# Patient Record
Sex: Female | Born: 1940
Health system: Southern US, Community
[De-identification: ages and names within clinical notes are randomized; demographics above are authoritative.]

## PROBLEM LIST (undated history)

## (undated) DIAGNOSIS — G4730 Sleep apnea, unspecified: Secondary | ICD-10-CM

## (undated) DIAGNOSIS — T8859XA Other complications of anesthesia, initial encounter: Secondary | ICD-10-CM

## (undated) DIAGNOSIS — I2510 Atherosclerotic heart disease of native coronary artery without angina pectoris: Secondary | ICD-10-CM

## (undated) DIAGNOSIS — J45909 Unspecified asthma, uncomplicated: Secondary | ICD-10-CM

## (undated) DIAGNOSIS — K589 Irritable bowel syndrome without diarrhea: Secondary | ICD-10-CM

## (undated) DIAGNOSIS — I10 Essential (primary) hypertension: Secondary | ICD-10-CM

## (undated) DIAGNOSIS — Z9889 Other specified postprocedural states: Secondary | ICD-10-CM

## (undated) DIAGNOSIS — D509 Iron deficiency anemia, unspecified: Secondary | ICD-10-CM

## (undated) DIAGNOSIS — E785 Hyperlipidemia, unspecified: Secondary | ICD-10-CM

## (undated) HISTORY — PX: TONSILLECTOMY: SUR1361

## (undated) HISTORY — DX: Essential (primary) hypertension: I10

## (undated) HISTORY — DX: Hyperlipidemia, unspecified: E78.5

## (undated) HISTORY — DX: Morbid (severe) obesity due to excess calories: E66.01

## (undated) HISTORY — DX: Irritable bowel syndrome, unspecified: K58.9

## (undated) HISTORY — PX: CARPAL TUNNEL RELEASE: SHX101

## (undated) HISTORY — PX: DILATION AND CURETTAGE OF UTERUS: SHX78

## (undated) HISTORY — DX: Iron deficiency anemia, unspecified: D50.9

## (undated) HISTORY — DX: Unspecified asthma, uncomplicated: J45.909

---

## 1985-05-19 HISTORY — PX: CHOLECYSTECTOMY OPEN: SUR202

## 1991-05-20 HISTORY — PX: OTHER SURGICAL HISTORY: SHX169

## 1998-11-11 ENCOUNTER — Inpatient Hospital Stay (HOSPITAL_COMMUNITY): Admission: EM | Admit: 1998-11-11 | Discharge: 1998-11-16 | Payer: Self-pay | Admitting: Cardiology

## 1998-11-15 ENCOUNTER — Encounter: Payer: Self-pay | Admitting: Cardiology

## 1998-11-16 ENCOUNTER — Encounter: Payer: Self-pay | Admitting: Cardiology

## 2003-01-25 HISTORY — PX: INCISIONAL HERNIA REPAIR: SHX193

## 2007-06-03 ENCOUNTER — Ambulatory Visit: Payer: Self-pay | Admitting: Cardiology

## 2007-07-04 ENCOUNTER — Encounter: Payer: Self-pay | Admitting: Cardiology

## 2007-07-05 ENCOUNTER — Ambulatory Visit: Payer: Self-pay | Admitting: Cardiology

## 2007-07-06 ENCOUNTER — Ambulatory Visit: Admission: AD | Admit: 2007-07-06 | Discharge: 2007-07-06 | Payer: Self-pay | Admitting: Cardiovascular Disease

## 2007-07-06 ENCOUNTER — Ambulatory Visit: Payer: Self-pay | Admitting: Cardiology

## 2007-07-06 ENCOUNTER — Inpatient Hospital Stay (HOSPITAL_COMMUNITY): Admission: EM | Admit: 2007-07-06 | Discharge: 2007-07-07 | Payer: Self-pay | Admitting: Emergency Medicine

## 2007-07-07 ENCOUNTER — Encounter: Payer: Self-pay | Admitting: Cardiovascular Disease

## 2007-07-07 ENCOUNTER — Ambulatory Visit: Payer: Self-pay | Admitting: Vascular Surgery

## 2007-07-27 ENCOUNTER — Ambulatory Visit: Payer: Self-pay | Admitting: Cardiology

## 2008-05-02 ENCOUNTER — Encounter: Payer: Self-pay | Admitting: Cardiology

## 2008-06-06 ENCOUNTER — Ambulatory Visit: Payer: Self-pay | Admitting: Cardiology

## 2008-06-16 ENCOUNTER — Encounter: Payer: Self-pay | Admitting: Cardiology

## 2008-10-03 ENCOUNTER — Ambulatory Visit: Payer: Self-pay | Admitting: Cardiology

## 2008-10-05 ENCOUNTER — Encounter: Payer: Self-pay | Admitting: Cardiology

## 2008-10-05 ENCOUNTER — Ambulatory Visit: Payer: Self-pay | Admitting: Cardiovascular Disease

## 2008-10-05 ENCOUNTER — Inpatient Hospital Stay (HOSPITAL_COMMUNITY): Admission: RE | Admit: 2008-10-05 | Discharge: 2008-10-06 | Payer: Self-pay | Admitting: Cardiovascular Disease

## 2008-10-27 ENCOUNTER — Encounter: Payer: Self-pay | Admitting: Physician Assistant

## 2008-10-27 ENCOUNTER — Ambulatory Visit: Payer: Self-pay | Admitting: Cardiology

## 2009-02-09 DIAGNOSIS — R079 Chest pain, unspecified: Secondary | ICD-10-CM | POA: Insufficient documentation

## 2009-02-09 DIAGNOSIS — E785 Hyperlipidemia, unspecified: Secondary | ICD-10-CM

## 2009-02-09 DIAGNOSIS — I10 Essential (primary) hypertension: Secondary | ICD-10-CM | POA: Insufficient documentation

## 2009-07-16 ENCOUNTER — Ambulatory Visit: Payer: Self-pay | Admitting: Cardiology

## 2009-07-16 DIAGNOSIS — R05 Cough: Secondary | ICD-10-CM

## 2009-07-16 DIAGNOSIS — I259 Chronic ischemic heart disease, unspecified: Secondary | ICD-10-CM | POA: Insufficient documentation

## 2009-07-16 DIAGNOSIS — I2510 Atherosclerotic heart disease of native coronary artery without angina pectoris: Secondary | ICD-10-CM | POA: Insufficient documentation

## 2010-03-01 ENCOUNTER — Ambulatory Visit: Payer: Self-pay | Admitting: Cardiology

## 2010-06-18 NOTE — Assessment & Plan Note (Signed)
Summary: 3 mo fu per sept reminder-srs   Visit Type:  Follow-up Primary Provider:  Dr. Donzetta Sprung  CC:  follow-up visit.  History of Present Illness: the patient is a 70 year old female with a history off nonobstructive coronary disease by cardiac catheterization in 2010.  She does have a prior history of drug alluding stent placement to the first diagonal branch in February 2009 shows moderate LAD stenosis but normal LV function.  She has hypertension and will obstructive cerebrovascular disease.  The patient currently denies any chest pain shortness of breath or palpitations.  She has been troubled however with bronchitic symptoms for the last several months starting around Thanksgiving.  She is mill several different antibiotics.chest noticed little improvement and continues to complain of a hoarseness and a raspy for his.  She continues have a persistent cough.  The chest x-ray has been obtained apparently no significant abnormality.  Interestingly however the patient is on lisinopril.  From a cardiovascular standpoint the patient denies any palpitations, presyncope or syncope.  She has no orthopnea PND.  There are no clinical heart failure symptoms.  Preventive Screening-Counseling & Management  Alcohol-Tobacco     Smoking Status: quit > 6 months  Comments: quit smoking about 44 yrs ago after smoking since 70 yrs old  Clinical Review Panels:  CXR CXR results The lungs are clear.  The heart is within normal limits         in size.  No bony abnormalities seen.          IMPRESSION:         No active lung disease. (07/04/2007)  Carotid Studies Carotid Doppler Results RIGHT CAROTID ARTERY: Mild bifurcation plaque without stenosis.         Normal wave forms.  No focal aliasing on color Doppler         interrogation.                   RIGHT VERTEBRAL ARTERY:  Normal flow direction and wave form.                   LEFT CAROTID ARTERY: Tortuous ICA.  No significant plaque  accumulation.  Normal wave forms.                   LEFT VERTEBRAL ARTERY:  Normal flow direction and wave form.                   IMPRESSION:         Mild right carotid bifurcation plaque with less than 50% diameter         stenosis. The exam does not exclude plaque ulceration or         embolization.  Continued surveillance recommended. (06/16/2008)  Cardiac Imaging Cardiac Cath Findings  ASSESSMENT:   1. Severe stenosis of the first diagonal branch with successful       stenting using a PROMUS drug-eluting stent.   2. Moderate left anterior descending and left circumflex stenosis.   3. Mild right coronary artery stenosis.   4. Normal left ventricular function.      RECOMMENDATIONS:  Tara Love will continue with aggressive medical   therapy, treating her risk factors with medication.  She should remain   on dual antiplatelet therapy with aspirin and Plavix for a minimum of 12   months.      Veverly Fells. Excell Seltzer, MD  (07/06/2007)    Current Medications (verified): 1)  Atenolol 50 Mg  Tabs (Atenolol) .... Take 1 Tablet By Mouth Once A Day 2)  Simvastatin 40 Mg Tabs (Simvastatin) .... Take 1 Tablet By Mouth At Bedtime 3)  Isosorbide Mononitrate Cr 60 Mg Xr24h-Tab (Isosorbide Mononitrate) .... Take One Tablet By Mouth Daily 4)  Nitrostat 0.4 Mg Subl (Nitroglycerin) .Marland Kitchen.. 1 Tablet Under Tongue At Onset of Chest Pain; You May Repeat Every 5 Minutes For Up To 3 Doses. 5)  Aspirin Ec 325 Mg Tbec (Aspirin) .... Take One Tablet By Mouth Daily 6)  Losartan Potassium 50 Mg Tabs (Losartan Potassium) .... Take 1 Tablet By Mouth Once A Day 7)  Hydrochlorothiazide 25 Mg Tabs (Hydrochlorothiazide) .... Take One Tablet By Mouth Daily. 8)  Allegra-D 24 Hour 180-240 Mg Xr24h-Tab (Fexofenadine-Pseudoephedrine) .... Take 1 Tablet By Mouth Once A Day X 2 Weeks. 9)  Omeprazole Magnesium 20.6 (20 Base) Mg Cpdr (Omeprazole Magnesium) .... Take 1 Tablet By Mouth Once A Day  Allergies: 1)  Codeine Phosphate  (Codeine Phosphate) 2)  Morphine Sulfate (Morphine Sulfate)  Comments:  Nurse/Medical Assistant: The patient's medications were reviewed with the patient and were updated in the Medication List. Pt verbally confirmed medications.  Cyril Loosen, RN, BSN (July 16, 2009 9:18 AM)  Past History:  Past Medical History: Last updated: 02/09/2009 HYPERTENSION, UNSPECIFIED (ICD-401.9) HYPERLIPIDEMIA-MIXED (ICD-272.4) CHEST PAIN-UNSPECIFIED (ICD-786.50) Nonobstructive cerebrovascular disease  Family History: Last updated: 07/16/2009 noncontributory  Social History: Last updated: 02/09/2009 Retired  Divorced  Tobacco Use - Former.   Risk Factors: Smoking Status: quit > 6 months (07/16/2009)  Family History: Reviewed history and no changes required. noncontributory  Social History: Reviewed history from 02/09/2009 and no changes required. Retired  Divorced  Tobacco Use - Former.  Smoking Status:  quit > 6 months  Review of Systems  The patient denies fatigue, malaise, fever, weight gain/loss, vision loss, decreased hearing, hoarseness, chest pain, palpitations, shortness of breath, prolonged cough, wheezing, sleep apnea, coughing up blood, abdominal pain, blood in stool, nausea, vomiting, diarrhea, heartburn, incontinence, blood in urine, muscle weakness, joint pain, leg swelling, rash, skin lesions, headache, fainting, dizziness, depression, anxiety, enlarged lymph nodes, easy bruising or bleeding, and environmental allergies.    Vital Signs:  Patient profile:   70 year old female Height:      64 inches Weight:      269 pounds BMI:     46.34 Pulse rate:   62 / minute BP sitting:   131 / 84  (left arm) Cuff size:   large  Vitals Entered By: Cyril Loosen, RN, BSN (July 16, 2009 9:09 AM)  Nutrition Counseling: Patient's BMI is greater than 25 and therefore counseled on weight management options. CC: follow-up visit   Physical Exam  Additional Exam:   General: Well-developed, well-nourished in no distress head: Normocephalic and atraumatic eyes PERRLA/EOMI intact, conjunctiva and lids normal nose: No deformity or lesions mouth normal dentition, normal posterior pharynx neck: Supple, no JVD.  No masses, thyromegaly or abnormal cervical nodes lungs: Normal breath sounds bilaterally without wheezing.  Normal percussion heart: regular rate and rhythm with normal S1 and S2, no S3 or S4.  PMI is normal.  No pathological murmurs abdomen: Normal bowel sounds, abdomen is soft and nontender without masses, organomegaly or hernias noted.  No hepatosplenomegaly musculoskeletal: Back normal, normal gait muscle strength and tone normal pulsus: Pulse is normal in all 4 extremities Extremities: No peripheral pitting edema neurologic: Alert and oriented x 3 skin: Intact without lesions or rashes cervical nodes: No significant adenopathy psychologic: Normal affect  EKG  Procedure date:  07/16/2009  Findings:      normal sinus rhythm normal EKG heart rate 68 beats/min  Impression & Recommendations:  Problem # 1:  COUGH, CHRONIC (ICD-786.2) the patient has now developed a chronic cough going on since Thanksgiving.  There is no evidence is related to heart failure.  As a matter fact that the history is highly suggestive of an upper airway cough syndrome.  Therefore I discontinued the patient's lisinopril and changed the medication to losartan 50 milligrams p.o. daily.  He also give a separate prescription forhydrochlorothiazide.  Also start the patient on omeprazole.  And asked her for a few weeks to take Allegra-D The following medications were removed from the medication list:    Nitroglycerin 0.2 Mg/hr Pt24 (Nitroglycerin) .Marland Kitchen... Apply 1 patch to skin once a day    Plavix 75 Mg Tabs (Clopidogrel bisulfate) .Marland Kitchen... Take 1 tablet by mouth once a day    Lisinopril-hydrochlorothiazide 20-25 Mg Tabs (Lisinopril-hydrochlorothiazide) .Marland Kitchen... Take 1 tablet by  mouth once a day Her updated medication list for this problem includes:    Atenolol 50 Mg Tabs (Atenolol) .Marland Kitchen... Take 1 tablet by mouth once a day    Isosorbide Mononitrate Cr 60 Mg Xr24h-tab (Isosorbide mononitrate) .Marland Kitchen... Take one tablet by mouth daily    Nitrostat 0.4 Mg Subl (Nitroglycerin) .Marland Kitchen... 1 tablet under tongue at onset of chest pain; you may repeat every 5 minutes for up to 3 doses.    Aspirin Ec 325 Mg Tbec (Aspirin) .Marland Kitchen... Take one tablet by mouth daily  Problem # 2:  CORONARY ARTERY DISEASE, S/P PTCA (ICD-414.9) the patient reports that her current chest pain.  No indication for ischemia workup at the present time. The following medications were removed from the medication list:    Nitroglycerin 0.2 Mg/hr Pt24 (Nitroglycerin) .Marland Kitchen... Apply 1 patch to skin once a day    Plavix 75 Mg Tabs (Clopidogrel bisulfate) .Marland Kitchen... Take 1 tablet by mouth once a day    Lisinopril-hydrochlorothiazide 20-25 Mg Tabs (Lisinopril-hydrochlorothiazide) .Marland Kitchen... Take 1 tablet by mouth once a day Her updated medication list for this problem includes:    Atenolol 50 Mg Tabs (Atenolol) .Marland Kitchen... Take 1 tablet by mouth once a day    Isosorbide Mononitrate Cr 60 Mg Xr24h-tab (Isosorbide mononitrate) .Marland Kitchen... Take one tablet by mouth daily    Nitrostat 0.4 Mg Subl (Nitroglycerin) .Marland Kitchen... 1 tablet under tongue at onset of chest pain; you may repeat every 5 minutes for up to 3 doses.    Aspirin Ec 325 Mg Tbec (Aspirin) .Marland Kitchen... Take one tablet by mouth daily  Problem # 3:  HYPERTENSION, UNSPECIFIED (ICD-401.9) blood pressure stable. The following medications were removed from the medication list:    Lisinopril-hydrochlorothiazide 20-25 Mg Tabs (Lisinopril-hydrochlorothiazide) .Marland Kitchen... Take 1 tablet by mouth once a day Her updated medication list for this problem includes:    Atenolol 50 Mg Tabs (Atenolol) .Marland Kitchen... Take 1 tablet by mouth once a day    Aspirin Ec 325 Mg Tbec (Aspirin) .Marland Kitchen... Take one tablet by mouth daily    Losartan  Potassium 50 Mg Tabs (Losartan potassium) .Marland Kitchen... Take 1 tablet by mouth once a day    Hydrochlorothiazide 25 Mg Tabs (Hydrochlorothiazide) .Marland Kitchen... Take one tablet by mouth daily.  Problem # 4:  HYPERLIPIDEMIA-MIXED (ICD-272.4) lipid panel and liver function tests are followed by the patient's primary care physician. Her updated medication list for this problem includes:    Simvastatin 40 Mg Tabs (Simvastatin) .Marland Kitchen... Take 1 tablet by mouth  at bedtime  Other Orders: EKG w/ Interpretation (93000)  Patient Instructions: 1)  Allegra-D x 2 weeks 2)  Stop Lisinopril/HCTZ 3)  Start Losartan 50mg  daily 4)  Start HCTZ 25mg  daily 5)  Omeprazole (Prilosec OTC) 20mg  daily 6)  Need to call office in 2 weeks to see if cough improved. 7)  Follow up in  6 months.   Prescriptions: HYDROCHLOROTHIAZIDE 25 MG TABS (HYDROCHLOROTHIAZIDE) Take one tablet by mouth daily.  #30 x 6   Entered by:   Hoover Brunette, LPN   Authorized by:   Lewayne Bunting, MD, [facility]   Signed by:   Hoover Brunette, LPN on 69/62/9528   Method used:   Electronically to        [facility] (retail)       [address]       [place], [place]  [address]       Ph: [phone]       Fax: [phone]   RxID:   [id number] LOSARTAN POTASSIUM 50 MG TABS (LOSARTAN POTASSIUM) Take 1 tablet by mouth once a day  #30 x 6   Entered by:   Hoover Brunette, LPN   Authorized by:   Lewayne Bunting, MD, [facility]   Signed by:   Hoover Brunette, LPN on 84/16/6063   Method used:   Electronically to        [facility] (retail)       [address]       [place], [place]  [address]       Ph: [phone]       Fax: [phone]   RxID:   [id number]

## 2010-06-18 NOTE — Assessment & Plan Note (Signed)
Summary: 6 mon ful fh   Visit Type:  Follow-up Primary Provider:  Dr. Donzetta Sprung   History of Present Illness: Tara Love is a 70 year oldfemale with a history of nonobstructive coronary artery disease documentd by cardiac catheterization in 2010. She had a prior drug-eluting stent to Tara diagonal in February of 2009 and  moderate LAD stenosis. She has hypertension and nonobstructive cerebrovascular disease.   She has been doing well. She denies any chest pain shortness of breath orthopnea PND she reports no palpitations or syncope. Her blood pressure is well controlled. She sees Dr. Reuel Boom on a regular basis and he also obtains blood work including her lipid panel  Preventive Screening-Counseling & Management  Alcohol-Tobacco     Smoking Status: current  Comments: Quit smoking around 45 yrs ago. Smoked for 18 yrs  Current Medications (verified): 1)  Simvastatin 40 Mg Tabs (Simvastatin) .... Take 1 Tablet By Mouth At Bedtime 2)  Nitrostat 0.4 Mg Subl (Nitroglycerin) .Marland Kitchen.. 1 Tablet Under Tongue At Onset of Chest Pain; You May Repeat Every 5 Minutes For Up To 3 Doses. 3)  Aspirin Ec 325 Mg Tbec (Aspirin) .... Take One Tablet By Mouth Daily 4)  Hydrochlorothiazide 25 Mg Tabs (Hydrochlorothiazide) .... Take One Tablet By Mouth Daily. 5)  Metoprolol Tartrate 50 Mg Tabs (Metoprolol Tartrate) .... Take 1 Tablet By Mouth Once A Day 6)  Furosemide 20 Mg Tabs (Furosemide) .... As Needed  Allergies: 1)  Codeine Phosphate (Codeine Phosphate) 2)  Morphine Sulfate (Morphine Sulfate)  Comments:  Nurse/Medical Assistant: Tara Love's medications were reviewed with Tara Love and were updated in Tara Medication List. Pt verbally confirmed medications. Pt will call if any medications are incorrect.  Cyril Loosen, RN, BSN (March 01, 2010 1:04 PM)  Past History:  Past Medical History: HYPERTENSION, UNSPECIFIED (ICD-401.9) HYPERLIPIDEMIA-MIXED (ICD-272.4) CHEST PAIN-UNSPECIFIED  (ICD-786.50) Nonobstructive cerebrovascular disease Cardiac catheterization in June 2010: Patent first diagonal stent, moderate LAD stenosis, nonobstructive stenosis in Tara left circumflex and right coronary artery, normal LV function  Social History: Smoking Status:  current  Review of Systems  Tara Love denies fatigue, malaise, fever, weight gain/loss, vision loss, decreased hearing, hoarseness, chest pain, palpitations, shortness of breath, prolonged cough, wheezing, sleep apnea, coughing up blood, abdominal pain, blood in stool, nausea, vomiting, diarrhea, heartburn, incontinence, blood in urine, muscle weakness, joint pain, leg swelling, rash, skin lesions, headache, fainting, dizziness, depression, anxiety, enlarged lymph nodes, easy bruising or bleeding, and environmental allergies.    Vital Signs:  Love profile:   70 year old female Height:      64 inches Weight:      271 pounds Pulse rate:   84 / minute BP sitting:   134 / 61  (left arm) Cuff size:   large  Vitals Entered By: Cyril Loosen, RN, BSN (March 01, 2010 12:56 PM) Comments Follow up visit. No cardiac complaints    Physical Exam  Additional Exam:  General: Well-developed, well-nourished in no distress head: Normocephalic and atraumatic eyes PERRLA/EOMI intact, conjunctiva and lids normal nose: No deformity or lesions mouth normal dentition, normal posterior pharynx neck: Supple, no JVD.  No masses, thyromegaly or abnormal cervical nodes lungs: Normal breath sounds bilaterally without wheezing.  Normal percussion heart: regular rate and rhythm with normal S1 and S2, no S3 or S4.  PMI is normal.  No pathological murmurs abdomen: Normal bowel sounds, abdomen is soft and nontender without masses, organomegaly or hernias noted.  No hepatosplenomegaly musculoskeletal: Back normal, normal gait muscle strength and tone  normal pulsus: Pulse is normal in all 4 extremities Extremities: No peripheral pitting  edema neurologic: Alert and oriented x 3 skin: Intact without lesions or rashes cervical nodes: No significant adenopathy psychologic: Normal affect    Impression & Recommendations:  Problem # 1:  CORONARY ARTERY DISEASE, S/P PTCA (ICD-414.9) no recurrent chest pain. No indication for stress testing continue risk factor modification Tara following medications were removed from Tara medication list:    Atenolol 50 Mg Tabs (Atenolol) .Marland Kitchen... Take 1 tablet by mouth once a day    Isosorbide Mononitrate Cr 60 Mg Xr24h-tab (Isosorbide mononitrate) .Marland Kitchen... Take one tablet by mouth daily Her updated medication list for this problem includes:    Nitrostat 0.4 Mg Subl (Nitroglycerin) .Marland Kitchen... 1 tablet under tongue at onset of chest pain; you may repeat every 5 minutes for up to 3 doses.    Aspirin Ec 325 Mg Tbec (Aspirin) .Marland Kitchen... Take one tablet by mouth daily    Metoprolol Tartrate 50 Mg Tabs (Metoprolol tartrate) .Marland Kitchen... Take 1 tablet by mouth once a day  Problem # 2:  COUGH, CHRONIC (ICD-786.2) Assessment: Comment Only resolved. Tara following medications were removed from Tara medication list:    Atenolol 50 Mg Tabs (Atenolol) .Marland Kitchen... Take 1 tablet by mouth once a day    Isosorbide Mononitrate Cr 60 Mg Xr24h-tab (Isosorbide mononitrate) .Marland Kitchen... Take one tablet by mouth daily Her updated medication list for this problem includes:    Nitrostat 0.4 Mg Subl (Nitroglycerin) .Marland Kitchen... 1 tablet under tongue at onset of chest pain; you may repeat every 5 minutes for up to 3 doses.    Aspirin Ec 325 Mg Tbec (Aspirin) .Marland Kitchen... Take one tablet by mouth daily    Metoprolol Tartrate 50 Mg Tabs (Metoprolol tartrate) .Marland Kitchen... Take 1 tablet by mouth once a day  Problem # 3:  HYPERTENSION, UNSPECIFIED (ICD-401.9) blood pressure well controlled. Continue current medical therapy Tara following medications were removed from Tara medication list:    Atenolol 50 Mg Tabs (Atenolol) .Marland Kitchen... Take 1 tablet by mouth once a day    Losartan  Potassium 50 Mg Tabs (Losartan potassium) .Marland Kitchen... Take 1 tablet by mouth once a day Her updated medication list for this problem includes:    Aspirin Ec 325 Mg Tbec (Aspirin) .Marland Kitchen... Take one tablet by mouth daily    Hydrochlorothiazide 25 Mg Tabs (Hydrochlorothiazide) .Marland Kitchen... Take one tablet by mouth daily.    Metoprolol Tartrate 50 Mg Tabs (Metoprolol tartrate) .Marland Kitchen... Take 1 tablet by mouth once a day    Furosemide 20 Mg Tabs (Furosemide) .Marland Kitchen... As needed  Love Instructions: 1)  Your physician recommends that you continue on your current medications as directed. Please refer to Tara Current Medication list given to you today. 2)  Follow up in  1 year

## 2010-10-01 NOTE — Assessment & Plan Note (Signed)
[facility]                          [facility]ICE NOTE   NAME:TURNERKalsey, Tara                        MRN:          [id number]  DATE:07/27/2007                            DOB:          [birth year]    REFERRING PHYSICIAN:  Donzetta Sprung   HISTORY OF PRESENT ILLNESS:  The patient is a 69 year old female with a  history of chest pain who was recently transferred to [facility].  The patient underwent cardiac catheterization and underwent  successful PCI stenting of the first diagonal branch with placement of a  2 x 5 x 12 Promus drug eluting stent.  The patient has been doing quite  well.  She reports no recurrent substernal chest pain.  She has no  shortness of breath, orthopnea or PND.  She has no complications related  to her groin site.   MEDICATIONS:  1. Aspirin 325 daily.  2. Plavix 75 mg p.o. daily.  3. Triamterine/hydrochlorothiazide 37/25 mg p.o. daily.  4. Simvastatin 40 mg daily.  5. Ferrous sulfate daily.   PHYSICAL EXAMINATION:  VITAL SIGNS:  Blood pressure 148/87, heart rate  87, weight is 240 pounds.  NECK:  Normal carotid stroke, soft bilateral carotid bruits.  LUNGS:  Clear breath sounds bilaterally.  HEART:  Regular rate and rhythm.  Normal S1, S2.  No murmurs, rubs or  gallops.  ABDOMEN:  Soft, nontender.  No rebound or guarding.  Good bowel sounds.  EXTREMITIES:  No cyanosis, clubbing or edema.  NEUROLOGICAL:  The patient is alert, oriented and grossly nonfocal.   IMPRESSION:  1. Coronary artery disease.      a.     Status post Promus drug eluting stent to the first diagonal.      b.     Normal LV (left ventricle) function.  2. Nonobstructive bilateral carotid artery stenosis 40-60%.  3. Cardiac risk factors per Dr. Reuel Boom.   PLAN:  1. The patient will continue current medical therapy.  She will need      to be on aspirin and Plavix for at least 1 year.  Unfortunately the      patient did not take aspirin  over the last several weeks and I      urged her to get this started immediately.  2. The patient's carotid Dopplers were reviewed and she has moderate      disease.  This will need to be followed up in 1 year.  3. Further risk factor modification per Dr. Reuel Boom.  The patient is to      be on aspirin and cholesterol lowering medication.  I also      recommended an exercise program.     Learta Codding, MD,FACC  Electronically Signed    GED/MedQ  DD: 07/27/2007  DT: 07/28/2007  Job #: [id number]   cc:   Donzetta Sprung

## 2010-10-01 NOTE — Assessment & Plan Note (Signed)
[facility]                          [facility] NOTE   NAME:[name], [name]                        MRN:          [id number]  DATE:06/06/2008                            DOB:          05/06/1941    REFERRING PHYSICIAN:  Dr. Donzetta Sprung.   HISTORY OF PRESENT ILLNESS:  The patient is a 70 year old female with  history of coronary artery disease.  The patient is status post PROMUS  drug-eluting stent to the first diagonal.  This was approximately a year  ago.  The patient stated even after catheterization, she continued to  have a mild degree of chest pain.  She reports more recently exertional  chest pain, for which she has taken nitroglycerin.  Her pattern,  however, has remained stable.  She states that after rest, it goes away  rather quickly.  She has some radiation to the left arm.  She only used  nitroglycerin on one occasion in the last couple of weeks.  EKG in the  office shows no acute ischemic changes.  The patient also reports leg  cramps, which are related to venous insufficiency after episodes of long  standing.   MEDICATIONS:  1. Aspirin 325 mg p.o. daily.  2. Plavix 75 mg p.o. daily.  3. Triamterene/hydrochlorothiazide 37.5/25 mg p.o. daily.  4. Simvastatin 40 mg p.o. at bedtime, but the patient was out of this.      The patient also is currently not taking her ferrous sulfate.   PHYSICAL EXAMINATION:  VITAL SIGNS:  Blood pressure 134/88, heart rate  is 80, and weight is 258 pounds.  NECK:  Normal carotid upstrokes and soft bilateral carotid bruits.  LUNGS:  Clear breath sounds bilaterally.  HEART:  Regular rate and rhythm.  Normal S1 and S2.  No murmurs, rubs,  or gallops.  ABDOMEN:  Soft and nontender.  No rebound or guarding.  Good bowel  sounds.  EXTREMITIES:  No cyanosis, clubbing, or edema.  NEUROLOGIC:  The patient is alert and oriented, grossly nonfocal.   PROBLEM LIST:  1. Coronary artery disease.      a.      Status post PROMUS drug-eluting stent to the first diagonal.      b.     Normal left ventricular function.      c.     Stable angina pattern.  2. Nonobstructive bilateral coronary artery stenosis of 40-60%.  3. Lower extremity pain secondary to venous insufficiency.   PLAN:  1. The patient currently has symptoms of angina, but they have always      been present since her catheterization.  Therefore, I am not      planning to perform immediately a stress test or a catheterization,      but try to intensify the patient's medical therapy.  I have      prescribed her atenolol 50 mg p.o. daily as well as a nitro patch.      She can also take p.r.n. nitroglycerin.  I will see the patient      back in a couple of weeks, and if she  has ongoing chest pain, we      will certainly proceed with a catheterization.  2. The patient is due for carotid Dopplers, and they have been      ordered.  I also gave the patient refill of all the medications, in      particularly I have told her that she needs to start back up on her      simvastatin and her lipid panel can be checked in 6-8 weeks.     Learta Codding, MD,FACC  Electronically Signed    GED/MedQ  DD: 06/06/2008  DT: 06/07/2008  Job #: [id number]   cc:   Donzetta Sprung

## 2010-10-01 NOTE — Assessment & Plan Note (Signed)
[facility]                          [facility]   NAME:Tara Love, Tara Love                        MRN:          [id number]  DATE:10/03/2008                            DOB:          December 07, 1940    HISTORY OF PRESENT ILLNESS:  The patient is a 70 year old female with a  history of coronary artery disease, status post problems drug-eluting  stent to the first diagonal last year.  Although, she had significant  angina relief post catheterization, the patient still have some residual  exertional angina.  Over the last 3 weeks, her angina actually has  intensified and has occurred at rest on a couple of occasions, but  always relieved rather rapidly with nitroglycerin.  She states she is  feeling similar symptoms once experienced before her catheterization.  There is associated shortness of breath and weakness.  The patient  states that she used approximately 6-7 nitroglycerins a week which is a  dramatic increase from a couple of months ago.   MEDICATIONS:  1. Plavix 75 mg a day.  2. Triamterene and hydrochlorothiazide 37/25 mg p.o. daily.  3. Simvastatin 40 mg p.o. nightly.  4. Aspirin 81 mg a day.  5. Atenolol 50 mg p.o. daily.   PHYSICAL EXAMINATION:  VITAL SIGNS:  Blood pressure 122/73, heart rate  61, weight 220 pounds.  GENERAL:  Overweight, white female, but in no apparent distress.  HEENT:  Pupils, eyes clear.  Conjunctivae clear.  NECK:  Supple.  Normal carotid upstroke and no carotid bruits.  LUNGS:  Clear breath sounds bilaterally.  HEART:  Regular rate and rhythm.  Normal S1 and S2.  No murmurs, rubs,  or gallops.  ABDOMEN:  Soft, nontender.  No rebound or guarding.  Good bowel sounds.  EXTREMITIES:  No cyanosis or clubbing.  There is trace to 1+ pitting  edema.   PROBLEM LIST:  1. Coronary artery disease.      a.     Recurrent substernal chest pain consistent with angina.      b.     Status post problems drug-eluting stent  to the first       diagonal 2009.      c.     Normal left ventricular function.  2. Lower extremity pain secondary to venous insufficiency.  3. Mild carotid artery disease less than 50% bilaterally.   PLAN:  1. The patient's symptoms are concerning for worsening angina.      Unfortunately, she did not get her nitroglycerin patch on the last      office visit.  I have given her prescription for Imdur 30 mg a day.      I have told the patient that I would like to proceed with      catheterization as soon as possible.  2. The patient has agreed and consented to proceed.  She has no      transportation and      therefore we will admit her as an inpatient.  I also think it is      highly likely that the patient will  require an intervention.  3. In the interim, a prescription for Imdur 30 mg a day was given.     Learta Codding, MD,FACC  Electronically Signed    GED/MedQ  DD: 10/03/2008  DT: 10/04/2008  Job #: [id number]

## 2010-10-01 NOTE — Cardiovascular Report (Signed)
NAMEUNA, YEOMANS               ACCOUNT NO.:  [id number]   MEDICAL RECORD NO.:  [id number]          PATIENT TYPE:  INP   LOCATION:  6532                         FACILITY:  [facility]   PHYSICIAN:  Veverly Fells. Excell Seltzer, MD  DATE OF BIRTH:  04-03-1941   DATE OF PROCEDURE:  07/06/2007  DATE OF DISCHARGE:                            CARDIAC CATHETERIZATION   PROCEDURE:  Left heart catheterization, selective coronary angiography,  left ventricular angiography, PTCA and stenting of the first diagonal,  Angio-Seal of the right femoral artery.   INDICATIONS:  Ms. Krupp is a 70 year old woman who presented to  [facility] with typical symptoms of unstable angina.  She ruled  out for a myocardial infarction.  She has classic symptoms and a past  history of mild to moderate CAD from catheterization 8 years ago.  She  was referred for cardiac catheterization in the setting of her typical  history.   Risks and indications of the procedure were reviewed with the patient  and informed consent was obtained.  The right groin was prepped, draped,  anesthetized with 1% lidocaine using the modified Seldinger technique.  A 6-French sheath was placed in the right femoral artery.  Standard  catheters were used for selective coronary angiography.  An angled  pigtail catheter was used for left ventriculography.  At the completion  of the procedure, I elected to intervene on a diagonal branch of the  LAD.   The first diagonal branch was extremely tortuous.  However, there was a  95% eccentric stenosis in the midportion of that branch, and it is a  large diagonal.  I suspected this was the culprit vessel, as there were  no other severe stenoses present.  Angiomax was used for  anticoagulation.  The patient had been preloaded with clopidogrel.  A 6-  [place] XB LAD 3.5-cm guide catheter was inserted.  Once a therapeutic  ACT was achieved, a BMW guidewire was passed through the diagonal branch  beyond the  area of severe stenosis with a moderate amount of difficulty.  The lesion was predilated with a 2.0 x 8 mm Voyager balloon up to 8  atmospheres.  Following predilatation, there is a great deal of wire  bias and recoil.  The area was stented with a 2.5 x 12 mm PROMUS stent  which was deployed at 14 atmospheres.  Following stenting, there was an  excellent angiographic result with good stent expansion.  There was a  significant amount of wire bias present, but there was TIMI III flow in  the vessel.  I attempted to post dilate the stent with a 2.75 x 8 mm  Voyager Ettrick noncompliant balloon, but was unable to pass it beyond the  stented segment.  The wire was removed and final angiography  demonstrated an excellent angiographic result.  The femoral arteriotomy  was sealed with an Angio-Seal device.   FINDINGS:  Aortic pressure 100/62 with a mean of 80, left ventricular  pressure 102/7.   CORONARY ANGIOGRAPHY:  Left mainstem:  The left mainstem has mild  luminal irregularities.  There are no significant stenoses.  It  bifurcates into the LAD and left circumflex.   LAD:  The LAD is a large-caliber vessel that has mild plaque in the  proximal segment.  Just after a large first diagonal branch, there is a  focal 50-60% stenosis that has been previously described.  The  midportion of the LAD has an area of bridging with minor plaque  associated.  The remaining portions of the mid and distal LAD have no  significant stenoses.  The first diagonal branch is a large and very  tortuous vessel.  There is an eccentric 95% stenosis in the proximal  portion of that vessel.   Left circumflex.  The left circumflex is a moderate size vessel.  There  is minor luminal irregularities in the proximal portion.  It supplies a  tiny first OM and a moderate-sized second OM branch.  There are no  significant stenoses throughout the left circumflex.   Right coronary artery:  The right coronary artery is a  large-caliber  vessel.  There is mild plaque in the proximal portion.  The midportion  of the right circumflex has a focal 50% stenosis.  Distally, the vessel  supplies a PDA branch and small posterolateral branch.   Left ventricular function assessed by 30 degree RAO left  ventriculography is normal.  The LVEF is estimated at 65%.   ASSESSMENT:  1. Severe stenosis of the first diagonal branch with successful      stenting using a PROMUS drug-eluting stent.  2. Moderate left anterior descending and left circumflex stenosis.  3. Mild right coronary artery stenosis.  4. Normal left ventricular function.   RECOMMENDATIONS:  Ms. Sakurai will continue with aggressive medical  therapy, treating her risk factors with medication.  She should remain  on dual antiplatelet therapy with aspirin and Plavix for a minimum of 12  months.      Veverly Fells. Excell Seltzer, MD  Electronically Signed     MDC/MEDQ  D:  07/06/2007  T:  07/07/2007  Job:  [id number]   cc:   Learta Codding, MD,FACC

## 2010-10-01 NOTE — Assessment & Plan Note (Signed)
[facility]                          [facility] NOTE   NAME:TURNERSanya, Love                        MRN:          [id number]  DATE:10/27/2008                            DOB:          Nov 04, 1940    PRIMARY CARDIOLOGIST:  Tara Codding, MD, [facility]   REASON FOR VISIT:  Post hospital followup.   Ms. Tara Love returns to our clinic, after being admitted for further  evaluation of symptoms worrisome for unstable angina pectoris.  Prior to  admission, Dr. Andee Lineman started her on Imdur 30 mg daily.   Coronary angiography, performed by Dr. Excell Seltzer, indicated patent first-  diagonal stent and unchanged, moderate stenosis of the LAD.  30% lesions  were noted in the proximal CFX and distal RCA.  Left ventricular  function was normal.   Clinically, Ms. Tara Love has noted some diminution in her chest pain  episodes, since taking the isosorbide.  She denies any complications of  the right groin incision site.   CURRENT MEDICATIONS:  1. Aspirin 81 daily.  2. Plavix.  3. Imdur 30 daily.  4. Simvastatin 40 nightly.  5. Maxzide 37.5/25 mg daily.   PHYSICAL EXAMINATION:  VITAL SIGNS:  Blood pressure 141/84, pulse 85,  regular, and weight 261.  GENERAL:  A 70 year old female, obese, sitting upright, no distress.  HEENT:  Normocephalic, atraumatic.  NECK:  Palpable bilateral carotid pulse without bruits.  LUNGS:  Clear to auscultation in all fields.  HEART:  Regular rate and rhythm.  No significant murmurs.  ABDOMEN:  Protuberant, intact bowel sounds.  EXTREMITIES:  Right groin stable with no ecchymosis, hematoma, or bruit  on auscultation; tactile femoral and distal pulses.  NEUROLOGIC:  No focal deficit.   IMPRESSION:  1. Atypical, nonexertional chest pain.      a.     Nonobstructive CAD by recent catheterization.      b.     Status post DES of severe, first-diagonal branch stenosis,       February 2009.      c.     Residual moderate LAD disease.  2.  Normal LVF.  3. Hypertension.  4. Nonobstructive cerebrovascular disease.  5. Dyslipidemia.   PLAN:  1. Adjust medication regimen as follows:  Up titration of Imdur to 60      mg daily, so as to try to provide better suppression of her      occasional angina.  I wonder if the etiology is coronary vasospasm.      We will discontinue Plavix, given that she is now 1-1/2 years out      from being treated with a drug-eluting stent.  She is otherwise to      remain on low-dose aspirin indefinitely.  2. Schedule return clinic followup with myself and Dr. Andee Lineman in 3      months, for review of clinical status and further recommendations.      Gene Serpe, PA-C  Electronically Signed      Tara Codding, MD,FACC  Electronically Signed   GS/MedQ  DD: 10/27/2008  DT: 10/28/2008  Job #: [id number]  cc:   Gar Ponto

## 2010-10-01 NOTE — Discharge Summary (Signed)
Tara Love, Tara Love               ACCOUNT NO.:  [id number]   MEDICAL RECORD NO.:  [id number]          PATIENT TYPE:  OIB   LOCATION:  2039                         FACILITY:  [facility]   PHYSICIAN:  Veverly Fells. Excell Seltzer, MD  DATE OF BIRTH:  1940-07-13   DATE OF ADMISSION:  10/05/2008  DATE OF DISCHARGE:  10/06/2008                               DISCHARGE SUMMARY   PRIMARY CARDIOLOGIST:  Learta Codding, MD, [facility]   DISCHARGE DIAGNOSIS:  Chest pain.   SECONDARY DIAGNOSES:  1. Coronary artery disease status post drug-eluting stent placement to      the first diagonal in 2009.  2. History of lower extremity pain secondary to venous insufficiency.  3. Mild carotid artery disease with less than 50% stenosis      bilaterally.  4. Hyperlipidemia.  5. Hypertension.   ALLERGIES:  CODEINE and MORPHINE SULFATE.   PROCEDURES:  Left heart cardiac catheterization showing nonobstructive  coronary artery disease with patent first diagonal stent, normal left  ventricular function with ejection fraction of 60%.   HISTORY OF PRESENT ILLNESS:  A 70 year old female with prior history of  CAD status post drug-eluting stent placement to the first diagonal in  2009.  Over the last 3 weeks, she has been having recurrent chest  discomfort relieved with nitroglycerin.  She saw Dr. Andee Lineman on May 18,  and decision was made to pursue cardiac catheterization.   HOSPITAL COURSE:  The patient presented for left heart cardiac  catheterization on May 20.  This showed patent previously placed  diagonal stent and otherwise, nonobstructive disease.  She had normal LV  function.  She has had no recurrent chest discomfort, will be discharged  home today in good condition.   DISCHARGE LABORATORY DATA:  None.   FOLLOWUP PLANS AND APPOINTMENTS:  The patient will follow up with Dr.  Andee Lineman on June 11, at 2 p.m.   DISCHARGE MEDICATIONS:  1. Imdur 60 mg daily.  2. Plavix 75 mg daily.  3. Triamterene/hydrochlorothiazide  37.5/25 mg daily.  4. Simvastatin 40 mg nightly.  5. Aspirin 81 mg daily.  6. Atenolol 50 mg daily.  7. Nitroglycerin 0.4 mg sublingual p.r.n. chest pain.   OUTSTANDING LABORATORY STUDIES:  None.   Duration of discharge encounter, 35 minutes including physician time.      Nicolasa Ducking, ANP      Veverly Fells. Excell Seltzer, MD  Electronically Signed    CB/MEDQ  D:  10/06/2008  T:  10/07/2008  Job:  [id number]

## 2010-10-01 NOTE — Discharge Summary (Signed)
Tara Love, Tara Love               ACCOUNT NO.:  [id number]   MEDICAL RECORD NO.:  [id number]          PATIENT TYPE:  INP   LOCATION:  6532                         FACILITY:  [facility]   PHYSICIAN:  Veverly Fells. Excell Seltzer, MD  DATE OF BIRTH:  06/01/1940   DATE OF ADMISSION:  07/06/2007  DATE OF DISCHARGE:  07/07/2007                               DISCHARGE SUMMARY   PRIMARY CARDIOLOGIST:  Learta Codding, MD,FACC   PRIMARY CARE Maleka Contino:  Donzetta Sprung.   DISCHARGE DIAGNOSES:  Unstable angina/coronary artery disease.   SECONDARY DIAGNOSES:  1. Iron-deficiency anemia.  2. Hypertension.  3. Morbid obesity.  4. Asthma.  5. Irritable bowel syndrome.   ALLERGIES:  CODEINE causes nausea.  MORPHINE causes nausea.   PROCEDURE:  Left heart cardiac catheterization, with successful PCI and  stenting of the first diagonal; with placement of a 2.5 x 12 mm Promus  drug-eluting stent.   HISTORY OF PRESENT ILLNESS:  A 70 year old Caucasian female with prior  history of chest pain, status post negative Myoview June 03, 2007.  She was in her usual state of health until July 03, 2007, when she  had substernal chest discomfort lasting 30-40 minutes and resolving  spontaneously.  She had recurrent symptoms February 15, prompting her to  present to the [facility] Emergency Room .  She was subsequently admitted  and ruled out for MI.  She was evaluated by Dr. Andee Lineman with [facility]  Cardiology, and decision was made to transfer to [facility] for further  evaluation and catheterization.   HOSPITAL COURSE:  The patient underwent left heart cardiac  catheterization on July 06, 2007; revealing a 95% stenosis in the  first diagonal, and otherwise nonobstructive disease throughout her  coronary tree.  Ejection fraction was 65%.  The diagonal was  successfully stented with 2.5 x 12 mm Promus drug eluting .  She  tolerated this procedure well and postprocedure has been ambulating  without recurrent chest  discomfort or limitations.   She will be discharged home today in satisfactory condition.   DISCHARGE LABS:  Hemoglobin 10.4, hematocrit 32.2, WBC 7.3, platelets  363.  Sodium 137, potassium 4.0, chloride 101, CO2 28, BUN 9, creatinine  0.95, glucose 95, CK 111, MB 2.2.   DISPOSITION:  The patient is being discharged home today in good  condition.   FOLLOW-UP APPOINTMENTS:  She is to follow up with Dr. Andee Lineman on July 26, 2008 at 1:45 p.m.Marland Kitchen  She has a followup with Dr. Reuel Boom as previously  scheduled.   DISCHARGE MEDICATIONS:  1. Aspirin 325 mg daily.  2. Plavix 75 mg daily.  3. Triamterene/HCTZ 37.5/25 mg daily.  4. Simvastatin 40 mg q.h.s.  5. Nitroglycerin 0.4 mg sublingual p.r.n. chest pain.  6. Ferrous sulfate 325 mg t.i.d.   LAB STUDIES:  Fasting lipids and liver function testing is pending.  Bilateral carotid ultrasound is pending.   DURATION OF DISCHARGE ENCOUNTER:  48 minutes, including the physician's  time.      Nicolasa Ducking, ANP      Veverly Fells. Excell Seltzer, MD  Electronically Signed    CB/MEDQ  D:  07/07/2007  T:  07/07/2007  Job:  [id number]   cc:   Donzetta Sprung

## 2011-02-07 LAB — CBC
HCT: 32.2 — ABNORMAL LOW
Hemoglobin: 10.4 — ABNORMAL LOW
MCV: 79.4
WBC: 7.3

## 2011-02-07 LAB — BASIC METABOLIC PANEL
Chloride: 101
GFR calc non Af Amer: 59 — ABNORMAL LOW
Glucose, Bld: 95
Potassium: 4
Sodium: 137

## 2011-02-07 LAB — HEPATIC FUNCTION PANEL
ALT: 13
AST: 22
Albumin: 3.1 — ABNORMAL LOW
Total Protein: 5.9 — ABNORMAL LOW

## 2011-02-07 LAB — LIPID PANEL
Cholesterol: 182
HDL: 46
LDL Cholesterol: 102 — ABNORMAL HIGH
Total CHOL/HDL Ratio: 4
Total CHOL/HDL Ratio: 4
Triglycerides: 170 — ABNORMAL HIGH
VLDL: 47 — ABNORMAL HIGH

## 2011-02-07 LAB — CK TOTAL AND CKMB (NOT AT ARMC): Relative Index: 2

## 2011-02-07 LAB — TROPONIN I: Troponin I: 0.17 — ABNORMAL HIGH

## 2011-03-21 ENCOUNTER — Encounter: Payer: Self-pay | Admitting: Cardiology

## 2011-03-24 ENCOUNTER — Ambulatory Visit (INDEPENDENT_AMBULATORY_CARE_PROVIDER_SITE_OTHER): Payer: Medicare Other | Admitting: Cardiology

## 2011-03-24 ENCOUNTER — Encounter: Payer: Self-pay | Admitting: Cardiology

## 2011-03-24 VITALS — BP 133/84 | HR 55 | Ht 64.0 in | Wt 274.0 lb

## 2011-03-24 DIAGNOSIS — I10 Essential (primary) hypertension: Secondary | ICD-10-CM

## 2011-03-24 DIAGNOSIS — I2510 Atherosclerotic heart disease of native coronary artery without angina pectoris: Secondary | ICD-10-CM

## 2011-03-24 DIAGNOSIS — I259 Chronic ischemic heart disease, unspecified: Secondary | ICD-10-CM

## 2011-03-24 DIAGNOSIS — E785 Hyperlipidemia, unspecified: Secondary | ICD-10-CM

## 2011-03-24 DIAGNOSIS — I779 Disorder of arteries and arterioles, unspecified: Secondary | ICD-10-CM

## 2011-03-24 NOTE — Assessment & Plan Note (Signed)
Patient is doing well. She reports no recurrent chest pain. Continue current medical therapy. No indication for ischemia workup

## 2011-03-24 NOTE — Progress Notes (Signed)
CC: Routine followup of patient with CAD  HPI: The patient is a 70 year old female with a history of coronary artery status post drug-eluting stent placement in 2009. She has nonobstructive carotid artery disease. She denies any chest pain shortness of breath orthopnea PND. She reports no palpitations or syncope. She is doing well from a cardiovascular perspective. She reports to me that her LDL is very well controlled and apparently is 29 mg percent on simvastatin therapy.   PMH: reviewed and listed in Problem List in Electronic Records (and see below)  Allergies/SH/FHX : available in Electronic Records for review  Medications: Current Outpatient Prescriptions  Medication Sig Dispense Refill  . aspirin EC 81 MG tablet Take 81 mg by mouth daily.        . furosemide (LASIX) 20 MG tablet Take 20 mg by mouth daily as needed.        . hydrochlorothiazide (HYDRODIURIL) 25 MG tablet Take 25 mg by mouth daily.        . metoprolol (LOPRESSOR) 50 MG tablet Take 50 mg by mouth daily.        . nitroGLYCERIN (NITROSTAT) 0.4 MG SL tablet Place 0.4 mg under the tongue every 5 (five) minutes as needed.        . simvastatin (ZOCOR) 40 MG tablet Take 40 mg by mouth at bedtime.          ROS: No nausea or vomiting. No fever or chills.No melena or hematochezia.No bleeding.No claudication  Physical Exam: BP 133/84  Pulse 55  Ht 5\' 4"  (1.626 m)  Wt 274 lb (124.286 kg)  BMI 47.03 kg/m2 General: Well-nourished white female in no apparent distress Neck: Normal carotid upstroke no carotid bruits. No thyromegaly nonnodular thyroid JVD is 5 cm. Lungs: Clear breath sounds bilaterally without wheezing. Cardiac: Regular rate and rhythm with normal S1-S2 no murmur rubs or gallops Vascular: Normal dorsalis pedis and posterior tibial pulses bilaterally. No edema. No cyanosis Skin: Warm and dry  12lead ECG: Normal sinus rhythm otherwise normal tracing Limited bedside ECHO:N/A   Assessment and Plan

## 2011-03-24 NOTE — Patient Instructions (Signed)
Your physician you to follow up in 1 year. You will receive a reminder letter in the mail one-two months in advance. If you don't receive a letter, please call our office to schedule the follow-up appointment. Your physician recommends that you continue on your current medications as directed. Please refer to the Current Medication list given to you today. 

## 2011-03-24 NOTE — Assessment & Plan Note (Signed)
No carotid bruits on exam. Consider repeat carotid Dopplers in one to 2 years.

## 2011-03-24 NOTE — Assessment & Plan Note (Signed)
Markedly decreased LDL simvastatin. Monitored by the patient's primary care physician

## 2011-03-24 NOTE — Assessment & Plan Note (Signed)
Blood pressure well controlled

## 2011-11-03 ENCOUNTER — Ambulatory Visit: Payer: Medicare Other | Admitting: Cardiology

## 2011-12-29 ENCOUNTER — Ambulatory Visit (INDEPENDENT_AMBULATORY_CARE_PROVIDER_SITE_OTHER): Payer: Medicare Other | Admitting: Cardiology

## 2011-12-29 VITALS — BP 150/90 | HR 69 | Ht 65.0 in | Wt 271.8 lb

## 2011-12-29 DIAGNOSIS — I779 Disorder of arteries and arterioles, unspecified: Secondary | ICD-10-CM

## 2011-12-29 DIAGNOSIS — I10 Essential (primary) hypertension: Secondary | ICD-10-CM

## 2011-12-29 DIAGNOSIS — E785 Hyperlipidemia, unspecified: Secondary | ICD-10-CM

## 2011-12-29 DIAGNOSIS — I259 Chronic ischemic heart disease, unspecified: Secondary | ICD-10-CM

## 2011-12-29 NOTE — Patient Instructions (Addendum)

## 2012-01-25 NOTE — Assessment & Plan Note (Signed)
stable °

## 2012-01-25 NOTE — Progress Notes (Signed)
Tara Bottoms, MD, [facility] Board Certified in Adult Cardiovascular Medicine,Internal Medicine and Critical Care Medicine    CC:     followup patient with coronary artery disease                                                                             HPI:        the patient is a 71 year oldfemale with a history of nonobstructive coronary artery disease documentd by cardiac catheterization in 2010. She had a prior drug-eluting stent to the diagonal in February of 2009 and moderate LAD stenosis. She has hypertension and nonobstructive cerebrovascular disease.  She has been doing well. She denies any chest pain shortness of breath orthopnea PND she reports no palpitations or syncope. Her blood pressure is well controlled. She sees Dr. Reuel Boom on a regular basis and he also obtains blood work including her lipid panel Patient is otherwise stable from a cardiovascular perspective.  She's been doing very well she reports no chest pain.  PMH: reviewed and listed in Problem List in Electronic Records (and see below) Past Medical History  Diagnosis Date  . Unspecified essential hypertension   . Other and unspecified hyperlipidemia   . Chest pain, unspecified   . Intermediate coronary syndrome   . Coronary atherosclerosis of native coronary artery 2010    Cardiac catheterization   . Morbid obesity   . Irritable bowel syndrome   . Unspecified asthma   . Iron deficiency anemia, unspecified   . Encounter for long-term (current) use of aspirin   . Encounter for long-term (current) use of other medications   . Shortness of breath    Past Surgical History  Procedure Date  . Tonsillectomy     as a child  . Dilation and curettage of uterus   . Cholecystectomy open 1987  . Gastric bypass surgery 1993    Subtotal gastrectomy  . Carpal tunnel release     Bilateral  . Incisional hernia repair 01/25/2003    with mesh    Allergies/SH/FHX : available in Electronic Records for review    Allergies  Allergen Reactions  . Codeine Phosphate     REACTION: nausea  . Morphine Sulfate     REACTION: nausea   History   Social History  . Marital Status: Divorced    Spouse Name: N/A    Number of Children: 2  . Years of Education: N/A   Occupational History  . RETIRED    Social History Main Topics  . Smoking status: Former Smoker -- 2.0 packs/day for 18 years    Types: Cigarettes    Quit date: 05/19/1965  . Smokeless tobacco: Former Neurosurgeon    Types: Snuff, Dorna Bloom    Quit date: 05/19/1946   Comment: Started smoking at age 87-has not smoked in 47 years/dipped snuff and chewed tobacco during childhood  . Alcohol Use: No  . Drug Use: No  . Sexually Active: Not on file   Other Topics Concern  . Not on file   Social History Narrative  . No narrative on file   Family History  Problem Relation Age of Onset  . Cancer Father     Prostate & Hx of  CHF  . Hernia Mother 48    Medications: Current Outpatient Prescriptions  Medication Sig Dispense Refill  . aspirin EC 81 MG tablet Take 81 mg by mouth daily.        . furosemide (LASIX) 20 MG tablet Take 20 mg by mouth daily as needed.        . hydrochlorothiazide (HYDRODIURIL) 25 MG tablet Take 25 mg by mouth daily.        . metoprolol (LOPRESSOR) 50 MG tablet Take 50 mg by mouth daily.        . nitroGLYCERIN (NITROSTAT) 0.4 MG SL tablet Place 0.4 mg under the tongue every 5 (five) minutes as needed.        . simvastatin (ZOCOR) 40 MG tablet Take 40 mg by mouth at bedtime.          ROS: No nausea or vomiting. No fever or chills.No melena or hematochezia.No bleeding.No claudication  Physical Exam: BP 150/90  Pulse 69  Ht 5\' 5"  (1.651 m)  Wt 271 lb 12 oz (123.265 kg)  BMI 45.22 kg/m2 General:well-nourished white female in no distress Neck:normal carotid upstroke and no carotid bruits Lungs:clear breath sounds bilaterally no wheezing Cardiac:regular rate and rhythm with normal S1-S2 no murmurs or gallops Vascular:no  edema and normal distal pulses Skin:warm and dry Physcologic:normal affect  12lead ECG:not obtained Limited bedside ECHO:N/A No images are attached to the encounter.   I reviewed and summarized the old records. I reviewed ECG and prior blood work.  Assessment and Plan  CORONARY ARTERY DISEASE, S/P PTCA stable  Carotid artery disease stable  HYPERLIPIDEMIA-MIXED followup primary care physician.  Patient has very low LDL    Patient Active Problem List  Diagnosis  . HYPERLIPIDEMIA-MIXED  . HYPERTENSION, UNSPECIFIED  . CORONARY ARTERY DISEASE, S/P PTCA  . COUGH, CHRONIC  . Carotid artery disease

## 2012-01-25 NOTE — Assessment & Plan Note (Signed)
followup primary care physician.  Patient has very low LDL

## 2012-11-30 ENCOUNTER — Encounter: Payer: Self-pay | Admitting: Cardiovascular Disease

## 2012-11-30 ENCOUNTER — Ambulatory Visit (INDEPENDENT_AMBULATORY_CARE_PROVIDER_SITE_OTHER): Payer: Medicare Other | Admitting: Cardiovascular Disease

## 2012-11-30 VITALS — BP 123/75 | HR 66 | Ht 65.0 in | Wt 265.0 lb

## 2012-11-30 DIAGNOSIS — E785 Hyperlipidemia, unspecified: Secondary | ICD-10-CM

## 2012-11-30 DIAGNOSIS — I2510 Atherosclerotic heart disease of native coronary artery without angina pectoris: Secondary | ICD-10-CM

## 2012-11-30 DIAGNOSIS — I10 Essential (primary) hypertension: Secondary | ICD-10-CM

## 2012-11-30 NOTE — Progress Notes (Signed)
Patient ID: Tara Love, female   DOB: 08/30/1940, 72 y.o.   MRN: [id number]    SUBJECTIVE: [name] is a 72 year old female with a history of nonobstructive coronary artery disease documentd by cardiac catheterization in 2010. She had a prior drug-eluting stent to the diagonal in February of 2009 and moderate LAD stenosis. She has hypertension, hyperlipidemia, and nonobstructive cerebrovascular disease.   She has been doing well. She has two episodes of chest pain a month, but did not require any SL nitro. It was relieved with Tylenol.  She has asthma and has chronic SOB. She reports no palpitations or syncope. Her blood pressure is well controlled. She sees Dr. Reuel Boom on a regular basis and he also obtains blood work including her lipid panel. Patient is otherwise stable from a cardiovascular perspective.    Filed Vitals:   11/30/12 1252  Height: 5\' 5"  (1.651 m)  Weight: 265 lb (120.203 kg)   BP: 123/75 mmHg HR: 66 bpm  PHYSICAL EXAM General: NAD Neck: No JVD, no thyromegaly or thyroid nodule.  Lungs: Clear to auscultation bilaterally with normal respiratory effort. CV: Nondisplaced PMI.  Heart regular S1/S2, no S3/S4, no murmur.  No peripheral edema.  No carotid bruit.  Normal pedal pulses.  Abdomen: Soft, nontender, no hepatosplenomegaly, no distention.  Neurologic: Alert and oriented x 3.  Psych: Normal affect. Extremities: No clubbing or cyanosis.    LABS: Basic Metabolic Panel: No results found for this basename: NA, K, CL, CO2, GLUCOSE, BUN, CREATININE, CALCIUM, MG, PHOS,  in the last 72 hours Liver Function Tests: No results found for this basename: AST, ALT, ALKPHOS, BILITOT, PROT, ALBUMIN,  in the last 72 hours No results found for this basename: LIPASE, AMYLASE,  in the last 72 hours CBC: No results found for this basename: WBC, NEUTROABS, HGB, HCT, MCV, PLT,  in the last 72 hours Cardiac Enzymes: No results found for this basename: CKTOTAL, CKMB, CKMBINDEX,  TROPONINI,  in the last 72 hours BNP: No components found with this basename: POCBNP,  D-Dimer: No results found for this basename: DDIMER,  in the last 72 hours Hemoglobin A1C: No results found for this basename: HGBA1C,  in the last 72 hours Fasting Lipid Panel: No results found for this basename: CHOL, HDL, LDLCALC, TRIG, CHOLHDL, LDLDIRECT,  in the last 72 hours Thyroid Function Tests: No results found for this basename: TSH, T4TOTAL, FREET3, T3FREE, THYROIDAB,  in the last 72 hours Anemia Panel: No results found for this basename: VITAMINB12, FOLATE, FERRITIN, TIBC, IRON, RETICCTPCT,  in the last 72 hours     ASSESSMENT AND PLAN: 1. CAD s/p DES to diagonal: fairly asymptomatic and stable. Continue ASA, Metoprolol, and Simvastatin. 2. HTN: controlled on current medical regimen. Continue HCTZ. She only takes Lasix on a prn basis for leg swelling during the summer.   Prentice Docker, M.D., F.A.C.C.

## 2012-11-30 NOTE — Patient Instructions (Signed)
Continue all current medications. Your physician wants you to follow up in: 6 months.  You will receive a reminder letter in the mail one-two months in advance.  If you don't receive a letter, please call our office to schedule the follow up appointment   

## 2013-02-14 ENCOUNTER — Inpatient Hospital Stay (HOSPITAL_COMMUNITY)
Admission: AD | Admit: 2013-02-14 | Discharge: 2013-02-16 | DRG: 287 | Disposition: A | Discharge status: Home / Self Care | Payer: Medicare Other | Source: Other Acute Inpatient Hospital | Attending: Internal Medicine | Admitting: Internal Medicine

## 2013-02-14 DIAGNOSIS — Z9861 Coronary angioplasty status: Secondary | ICD-10-CM

## 2013-02-14 DIAGNOSIS — J45909 Unspecified asthma, uncomplicated: Secondary | ICD-10-CM | POA: Diagnosis present

## 2013-02-14 DIAGNOSIS — E871 Hypo-osmolality and hyponatremia: Secondary | ICD-10-CM | POA: Diagnosis present

## 2013-02-14 DIAGNOSIS — I739 Peripheral vascular disease, unspecified: Secondary | ICD-10-CM | POA: Diagnosis present

## 2013-02-14 DIAGNOSIS — I259 Chronic ischemic heart disease, unspecified: Secondary | ICD-10-CM | POA: Diagnosis present

## 2013-02-14 DIAGNOSIS — Z7982 Long term (current) use of aspirin: Secondary | ICD-10-CM

## 2013-02-14 DIAGNOSIS — I509 Heart failure, unspecified: Secondary | ICD-10-CM | POA: Diagnosis present

## 2013-02-14 DIAGNOSIS — D509 Iron deficiency anemia, unspecified: Secondary | ICD-10-CM | POA: Diagnosis present

## 2013-02-14 DIAGNOSIS — Z6841 Body Mass Index (BMI) 40.0 and over, adult: Secondary | ICD-10-CM

## 2013-02-14 DIAGNOSIS — Z79899 Other long term (current) drug therapy: Secondary | ICD-10-CM

## 2013-02-14 DIAGNOSIS — I10 Essential (primary) hypertension: Secondary | ICD-10-CM | POA: Diagnosis present

## 2013-02-14 DIAGNOSIS — E785 Hyperlipidemia, unspecified: Secondary | ICD-10-CM | POA: Diagnosis present

## 2013-02-14 DIAGNOSIS — R079 Chest pain, unspecified: Secondary | ICD-10-CM

## 2013-02-14 DIAGNOSIS — I2510 Atherosclerotic heart disease of native coronary artery without angina pectoris: Principal | ICD-10-CM | POA: Diagnosis present

## 2013-02-14 DIAGNOSIS — I200 Unstable angina: Secondary | ICD-10-CM | POA: Diagnosis present

## 2013-02-14 DIAGNOSIS — R9430 Abnormal result of cardiovascular function study, unspecified: Secondary | ICD-10-CM

## 2013-02-14 DIAGNOSIS — Z87891 Personal history of nicotine dependence: Secondary | ICD-10-CM

## 2013-02-14 HISTORY — DX: Atherosclerotic heart disease of native coronary artery without angina pectoris: I25.10

## 2013-02-14 MED ORDER — SODIUM CHLORIDE 0.9 % IJ SOLN
3.0000 mL | Freq: Two times a day (BID) | INTRAMUSCULAR | Status: DC
  Administered 2013-02-15 (×2): 3 mL via INTRAVENOUS

## 2013-02-14 MED ORDER — SODIUM CHLORIDE 0.9 % IJ SOLN: 3.0000 mL | INTRAMUSCULAR | Status: DC | PRN

## 2013-02-14 MED ORDER — ASPIRIN EC 81 MG PO TBEC
81.0000 mg | DELAYED_RELEASE_TABLET | Freq: Every day | ORAL | Status: DC
  Filled 2013-02-14: qty 1

## 2013-02-14 MED ORDER — ASPIRIN 300 MG RE SUPP
300.0000 mg | RECTAL | Status: AC
  Filled 2013-02-14: qty 1

## 2013-02-14 MED ORDER — ASPIRIN 81 MG PO CHEW
324.0000 mg | CHEWABLE_TABLET | ORAL | Status: AC
  Administered 2013-02-15: 324 mg via ORAL
  Filled 2013-02-14: qty 4

## 2013-02-14 MED ORDER — ACETAMINOPHEN 325 MG PO TABS: 650.0000 mg | ORAL_TABLET | ORAL | Status: DC | PRN

## 2013-02-14 MED ORDER — SIMVASTATIN 40 MG PO TABS
40.0000 mg | ORAL_TABLET | Freq: Every day | ORAL | Status: DC
  Administered 2013-02-15 (×2): 40 mg via ORAL
  Filled 2013-02-14 (×4): qty 1

## 2013-02-14 MED ORDER — NITROGLYCERIN 0.4 MG SL SUBL: 0.4000 mg | SUBLINGUAL_TABLET | SUBLINGUAL | Status: DC | PRN

## 2013-02-14 MED ORDER — METOPROLOL TARTRATE 50 MG PO TABS
50.0000 mg | ORAL_TABLET | Freq: Every day | ORAL | Status: DC
  Administered 2013-02-15 – 2013-02-16 (×2): 50 mg via ORAL
  Filled 2013-02-14 (×2): qty 1

## 2013-02-14 MED ORDER — ONDANSETRON HCL 4 MG/2ML IJ SOLN: 4.0000 mg | Freq: Four times a day (QID) | INTRAMUSCULAR | Status: DC | PRN

## 2013-02-14 MED ORDER — SODIUM CHLORIDE 0.9 % IV SOLN: 250.0000 mL | INTRAVENOUS | Status: DC | PRN

## 2013-02-14 NOTE — H&P (Signed)
History and Physical  Patient ID: Tara Love MRN: [id number], SOB: 01-Aug-1940 72 y.o. Date of Encounter: 02/14/2013, 10:47 PM  Primary Physician: Donzetta Sprung, MD Primary Cardiologist: Dr. Purvis Sheffield  Chief Complaint: chest pain  HPI: 72 y.o. female w/ PMHx significant for CAD s/p PIC 06/2007, HTN, obesity, who initially presented to [facility] with chest pain on Saturday, now transferred after workup revealed potential ischemia based upon an MPI.  Pt with occasional angina that is relieved with SL nitro. 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 She sought emergent care at [facility] and continued to have chest pain. Finally relieved in the ER with IV narcotics.   Notes reveal that she had negative enzymes, negative d-dimer. Seen by Dr. Ivan Croft with cardiology and she underwent a MPI which per the notes, demonstrated a reversible defect in the anterior and lateral walls. Echo also performed per the notes and she was found to have LVH with nl EF.   She was accepted to [facility] for further evaluation with cardiac cath.  Last cath appears to be 09/2008 which demonstrated patent stent from a year earlier and mild to moderate disease in the LAD and LCX.   Currently she is chest pain free, comfortable and without complaints.   EKG revealed NSR with no acute ST changes. CXR reported with cardiomegaly, hyperinflation. Labs demonstrate nl CBC, mild hyponatremia, nl BNP, negative troponins, nl INR.Marland Kitchen   Past Medical History  Diagnosis Date  . Unspecified essential hypertension   . Other and unspecified hyperlipidemia   . Chest pain, unspecified   . Intermediate coronary syndrome   . Coronary atherosclerosis of native coronary artery 2010    Cardiac catheterization   . Morbid obesity   . Irritable bowel syndrome   . Unspecified asthma(493.90)   . Iron deficiency anemia,  unspecified   . Encounter for long-term (current) use of aspirin   . Encounter for long-term (current) use of other medications   . Shortness of breath      Surgical History:  Past Surgical History  Procedure Laterality Date  . Tonsillectomy      as a child  . Dilation and curettage of uterus    . Cholecystectomy open  1987  . Gastric bypass surgery  1993    Subtotal gastrectomy  . Carpal tunnel release      Bilateral  . Incisional hernia repair  01/25/2003    with mesh     Home Meds: Prior to Admission medications   Medication Sig Start Date End Date Taking? Authorizing Provider  acetaminophen (TYLENOL) 500 MG tablet Take 500 mg by mouth every 6 (six) hours as needed for pain. For pain in back and knee   Yes Historical Provider, MD  aspirin EC 81 MG tablet Take 81 mg by mouth daily.     Yes Historical Provider, MD  furosemide (LASIX) 20 MG tablet Take 20 mg by mouth daily as needed for fluid.    Yes Historical Provider, MD  hydrochlorothiazide (HYDRODIURIL) 25 MG tablet Take 25 mg by mouth daily.     Yes Historical Provider, MD  metoprolol (LOPRESSOR) 50 MG tablet Take 50 mg by mouth daily.     Yes Historical Provider, MD  nitroGLYCERIN (NITROSTAT) 0.4 MG SL tablet Place 0.4 mg under the tongue every 5 (five) minutes as needed for chest pain. Last taken on 02-12-13   Yes Historical  Provider, MD  simvastatin (ZOCOR) 40 MG tablet Take 40 mg by mouth at bedtime.     Yes Historical Provider, MD    Allergies:  Allergies  Allergen Reactions  . Codeine Phosphate     REACTION: nausea  . Morphine Sulfate     REACTION: nausea    History   Social History  . Marital Status: Divorced    Spouse Name: N/A    Number of Children: 2  . Years of Education: N/A   Occupational History  . RETIRED    Social History Main Topics  . Smoking status: Former Smoker -- 2.00 packs/day for 18 years    Types: Cigarettes    Quit date: 05/19/1965  . Smokeless tobacco: Former Neurosurgeon    Types:  Snuff, Dorna Bloom    Quit date: 05/19/1946     Comment: Started smoking at age 77-has not smoked in 75 years/dipped snuff and chewed tobacco during childhood  . Alcohol Use: No  . Drug Use: No  . Sexual Activity: Not on file   Other Topics Concern  . Not on file   Social History Narrative  . No narrative on file     Family History  Problem Relation Age of Onset  . Cancer Father     Prostate & Hx of CHF  . Hernia Mother 33   Review of Systems: General: negative for chills, fever, night sweats or weight changes.  Cardiovascular: see HPI Dermatological: negative for rash Respiratory: negative for cough or wheezing Urologic: negative for hematuria Abdominal: negative for nausea, vomiting, diarrhea, bright red blood per rectum, melena, or hematemesis Neurologic: negative for visual changes, syncope, or dizziness All other systems reviewed and are otherwise negative except as noted above.  Labs:   Lab Results  Component Value Date   WBC 7.3 07/07/2007   HGB 10.4* 07/07/2007   HCT 32.2* 07/07/2007   MCV 79.4 07/07/2007   PLT 363 07/07/2007   No results found for this basename: NA, K, CL, CO2, BUN, CREATININE, CALCIUM, LABALBU, PROT, BILITOT, ALKPHOS, ALT, AST, GLUCOSE,  in the last 168 hours No results found for this basename: CKTOTAL, CKMB, TROPONINI,  in the last 72 hours Lab Results  Component Value Date   CHOL  Value: 180        ATP III CLASSIFICATION:  <200     mg/dL   Desirable  696-295  mg/dL   Borderline High  >=284    mg/dL   High 1/32/4401   HDL 45 07/07/2007   LDLCALC  Value: 88        Total Cholesterol/HDL:CHD Risk Coronary Heart Disease Risk Table                     Men   Women  1/2 Average Risk   3.4   3.3 07/07/2007   TRIG 237* 07/07/2007   No results found for this basename: DDIMER    Radiology/Studies:  No results found.   EKG: from OSH: nl sinus, no ST or TW changes  Physical Exam: Blood pressure 123/80, pulse 71, temperature 97.9 F (36.6 C), temperature source  Oral, resp. rate 18, height 5\' 5"  (1.651 m), weight 122.2 kg (269 lb 6.4 oz), SpO2 95.00%. General: Well developed, well nourished, in no acute distress. Obese Head: Normocephalic, atraumatic, sclera non-icteric, nares are without discharge Neck: Supple. Negative for carotid bruits. JVD not elevated. Lungs: Clear bilaterally to auscultation without wheezes, rales, or rhonchi. Breathing is unlabored. Heart: RRR with S1 S2. No  murmurs, rubs, or gallops appreciated. Abdomen: Soft, non-tender, non-distended with normoactive bowel sounds. No rebound/guarding. No obvious abdominal masses. Msk:  Strength and tone appear normal for age. Extremities: trace edema. No clubbing or cyanosis. Distal pedal pulses are 2+ and equal bilaterally. Neuro: Alert and oriented X 3. Moves all extremities spontaneously. Psych:  Responds to questions appropriately with a normal affect.   Problem List 1. Angina, with high risk stress test demonstrated anterior and lateral ischemia 2. HTN 3. Hyperlipidemia 4. Morbid obesity 5. Hyponatremia  ASSESSMENT AND PLAN:  72 y.o. female w/ PMHx significant for CAD s/p PIC 06/2007, HTN, obesity, who initially presented to [facility] with chest pain on Saturday, now transferred after workup revealed potential ischemia based upon an MPI.  Currently chest pain free and without complaints. Anticipate cardiac cath to investigate high risk MPI scan. NPO for cath- hopefully can go radially..  Continue current aspirin, statin, beta blocker. Hold HCTZ and re-assess her hyponatremia. ACEI maybe preferable given her PVD and CAD.  Prophylaxis: Ambulating  Full code  Signed, Adolm Joseph, Naomee Nowland C. MD 02/14/2013, 10:47 PM

## 2013-02-15 ENCOUNTER — Encounter (HOSPITAL_COMMUNITY)
Admission: AD | Disposition: A | Discharge status: Home / Self Care | Payer: Self-pay | Source: Other Acute Inpatient Hospital | Attending: Internal Medicine

## 2013-02-15 ENCOUNTER — Encounter (HOSPITAL_COMMUNITY): Payer: Self-pay | Admitting: *Deleted

## 2013-02-15 DIAGNOSIS — E785 Hyperlipidemia, unspecified: Secondary | ICD-10-CM

## 2013-02-15 DIAGNOSIS — I10 Essential (primary) hypertension: Secondary | ICD-10-CM

## 2013-02-15 DIAGNOSIS — I2510 Atherosclerotic heart disease of native coronary artery without angina pectoris: Secondary | ICD-10-CM

## 2013-02-15 HISTORY — PX: LEFT HEART CATHETERIZATION WITH CORONARY ANGIOGRAM: SHX5451

## 2013-02-15 LAB — CBC
HCT: 37.5 % (ref 36.0–46.0)
MCV: 94.5 fL (ref 78.0–100.0)
Platelets: 241 10*3/uL (ref 150–400)
RBC: 3.97 MIL/uL (ref 3.87–5.11)
RDW: 14.6 % (ref 11.5–15.5)
WBC: 8.6 10*3/uL (ref 4.0–10.5)

## 2013-02-15 LAB — BASIC METABOLIC PANEL
BUN: 11 mg/dL (ref 6–23)
CO2: 28 mEq/L (ref 19–32)
Chloride: 100 mEq/L (ref 96–112)
Creatinine, Ser: 0.84 mg/dL (ref 0.50–1.10)
GFR calc Af Amer: 78 mL/min — ABNORMAL LOW (ref >90)
Glucose, Bld: 100 mg/dL — ABNORMAL HIGH (ref 70–99)
Potassium: 3.7 mEq/L (ref 3.5–5.1)

## 2013-02-15 LAB — LIPID PANEL
HDL: 49 mg/dL (ref >39)
LDL Cholesterol: 31 mg/dL (ref 0–99)
Total CHOL/HDL Ratio: 2.6 RATIO
VLDL: 45 mg/dL — ABNORMAL HIGH (ref 0–40)

## 2013-02-15 LAB — PROTIME-INR: Prothrombin Time: 12.2 seconds (ref 11.6–15.2)

## 2013-02-15 SURGERY — LEFT HEART CATHETERIZATION WITH CORONARY ANGIOGRAM
Anesthesia: LOCAL

## 2013-02-15 MED ORDER — HEPARIN SODIUM (PORCINE) 1000 UNIT/ML IJ SOLN
INTRAMUSCULAR | Status: AC
  Filled 2013-02-15: qty 1

## 2013-02-15 MED ORDER — SODIUM CHLORIDE 0.9 % IJ SOLN: 3.0000 mL | INTRAMUSCULAR | Status: DC | PRN

## 2013-02-15 MED ORDER — SODIUM CHLORIDE 0.9 % IV SOLN
1.0000 mL/kg/h | INTRAVENOUS | Status: AC
  Administered 2013-02-15: 1 mL/kg/h via INTRAVENOUS

## 2013-02-15 MED ORDER — FENTANYL CITRATE 0.05 MG/ML IJ SOLN
INTRAMUSCULAR | Status: AC
  Filled 2013-02-15: qty 2

## 2013-02-15 MED ORDER — ASPIRIN EC 325 MG PO TBEC
325.0000 mg | DELAYED_RELEASE_TABLET | Freq: Once | ORAL | Status: AC
  Administered 2013-02-15: 325 mg via ORAL
  Filled 2013-02-15: qty 1

## 2013-02-15 MED ORDER — ASPIRIN 81 MG PO CHEW: 81.0000 mg | CHEWABLE_TABLET | ORAL | Status: DC

## 2013-02-15 MED ORDER — HEPARIN (PORCINE) IN NACL 2-0.9 UNIT/ML-% IJ SOLN
INTRAMUSCULAR | Status: AC
  Filled 2013-02-15: qty 1000

## 2013-02-15 MED ORDER — SODIUM CHLORIDE 0.9 % IV SOLN: INTRAVENOUS | Status: DC

## 2013-02-15 MED ORDER — SODIUM CHLORIDE 0.9 % IV SOLN: 250.0000 mL | INTRAVENOUS | Status: DC | PRN

## 2013-02-15 MED ORDER — LIDOCAINE HCL (PF) 1 % IJ SOLN
INTRAMUSCULAR | Status: AC
  Filled 2013-02-15: qty 30

## 2013-02-15 MED ORDER — ASPIRIN EC 81 MG PO TBEC
81.0000 mg | DELAYED_RELEASE_TABLET | Freq: Every day | ORAL | Status: DC
  Administered 2013-02-16: 81 mg via ORAL
  Filled 2013-02-15: qty 1

## 2013-02-15 MED ORDER — VERAPAMIL HCL 2.5 MG/ML IV SOLN
INTRAVENOUS | Status: AC
  Filled 2013-02-15: qty 2

## 2013-02-15 MED ORDER — MIDAZOLAM HCL 2 MG/2ML IJ SOLN
INTRAMUSCULAR | Status: AC
  Filled 2013-02-15: qty 2

## 2013-02-15 MED ORDER — SODIUM CHLORIDE 0.9 % IJ SOLN: 3.0000 mL | Freq: Two times a day (BID) | INTRAMUSCULAR | Status: DC

## 2013-02-15 NOTE — Progress Notes (Signed)
Patient ID: Tara Love, female   DOB: 07-27-1940, 72 y.o.   MRN: [id number]    Subjective:  Denies SSCP, palpitations or Dyspnea   Objective:  Filed Vitals:   02/14/13 2130 02/15/13 0521  BP: 123/80 137/61  Pulse: 71 79  Temp: 97.9 F (36.6 C) 98 F (36.7 C)  TempSrc: Oral Oral  Resp: 18 18  Height: 5\' 5"  (1.651 m)   Weight: 269 lb 6.4 oz (122.2 kg)   SpO2: 95% 95%    Intake/Output from previous day: No intake or output data in the 24 hours ending 02/15/13 0843  Physical Exam: Affect appropriate Obese white female  HEENT: normal Neck supple with no adenopathy JVP normal no bruits no thyromegaly Lungs clear with no wheezing and good diaphragmatic motion Heart:  S1/S2 no murmur, no rub, gallop or click PMI normal Abdomen: benighn, BS positve, no tenderness, no AAA no bruit.  No HSM or HJR Distal pulses intact with no bruits No edema Neuro non-focal Skin warm and dry No muscular weakness   Lab Results: Basic Metabolic Panel:  Recent Labs  40/98/11 0505  NA 138  K 3.7  CL 100  CO2 28  GLUCOSE 100*  BUN 11  CREATININE 0.84  CALCIUM 8.3*   CBC:  Recent Labs  02/15/13 0505  WBC 8.6  HGB 12.1  HCT 37.5  MCV 94.5  PLT 241   Fasting Lipid Panel:  Recent Labs  02/15/13 0500  CHOL 125  HDL 49  LDLCALC 31  TRIG 227*  CHOLHDL 2.6    Imaging: No results found.  Cardiac Studies:  ECG:    Telemetry:  NSR no arrhythmia 02/15/2013   Echo:   Medications:   . aspirin EC  325 mg Oral Once  . [START ON 02/16/2013] aspirin EC  81 mg Oral Daily  . metoprolol  50 mg Oral Daily  . simvastatin  40 mg Oral QHS  . sodium chloride  3 mL Intravenous Q12H     . sodium chloride      Assessment/Plan:  HTN:  Continue beta blocker consider adding ACE Chol:  With CAD continue statin LDL at goal 77 CAD:  Stent to diagonal in 2009  Moderate disease circumflex/LAD  myovue with ? Anterior ischemia at [facility] today with Dr [place]  Romaine Neville  [facility] 02/15/2013, 8:43 AM

## 2013-02-15 NOTE — Interval H&P Note (Signed)
History and Physical Interval Note:  02/15/2013 1:03 PM  Reita May  has presented today for surgery, with the diagnosis of cp  The various methods of treatment have been discussed with the patient and family. After consideration of risks, benefits and other options for treatment, the patient has consented to  Procedure(s): LEFT HEART CATHETERIZATION WITH CORONARY ANGIOGRAM (N/A) as a surgical intervention .  The patient's history has been reviewed, patient examined, no change in status, stable for surgery.  I have reviewed the patient's chart and labs.  Questions were answered to the patient's satisfaction.   Cath Lab Visit (complete for each Cath Lab visit)  Clinical Evaluation Leading to the Procedure:   ACS: yes  Non-ACS:    Anginal Classification: CCS III  Anti-ischemic medical therapy: Minimal Therapy (1 class of medications)  Non-Invasive Test Results: Intermediate-risk stress test findings: cardiac mortality 1-3%/year  Prior CABG: No previous CABG        Theron Arista [facility] 02/15/2013 1:03 PM

## 2013-02-15 NOTE — CV Procedure (Signed)
   Cardiac Catheterization Procedure Note  Name: Tara Love MRN: [id number] DOB: 06/17/1940  Procedure: Left Heart Cath, Selective Coronary Angiography, LV angiography  Indication: 72 yo WF with history of HTN and CAD s/p stenting of the diagonal in 2009 presents with symptoms of chest pain. Myoview study is suggestive of anterior ischemia.   Procedural Details: The right wrist was prepped, draped, and anesthetized with 1% lidocaine. Using the modified Seldinger technique, a 5 French sheath was introduced into the right radial artery. 3 mg of verapamil was administered through the sheath, weight-based unfractionated heparin was administered intravenously. Standard Judkins catheters were used for selective coronary angiography and left ventriculography. Catheter exchanges were performed over an exchange length guidewire. There were no immediate procedural complications. A TR band was used for radial hemostasis at the completion of the procedure.  The patient was transferred to the post catheterization recovery area for further monitoring.  Procedural Findings: Hemodynamics: AO 117/60 mean 87 mm Hg LV 117/17 mm Hg  Coronary angiography: Coronary dominance: right  Left mainstem: Normal.  Left anterior descending (LAD): 30% immediately after takeoff of the first diagonal. The first diagonal stent is widely patent. There is an eccentric 50-70% stenosis just proximal to the stent.  Left circumflex (LCx): 30% mid vessel.  Right coronary artery (RCA): moderately calcified. Mild irregularities less than 20%.  Left ventriculography: Left ventricular systolic function is normal, LVEF is estimated at 55-65%, there is no significant mitral regurgitation   Final Conclusions:   1. Nonobstructive CAD. No significant change compared to last study in 2010. 2. Normal LV function.   Recommendations: Continue medical Rx.  Theron Arista [facility] 02/15/2013, 1:36 PM

## 2013-02-15 NOTE — H&P (View-Only) (Signed)
Patient ID: Tara Love, female   DOB: 04/01/1941, 72 y.o.   MRN: [id number]    Subjective:  Denies SSCP, palpitations or Dyspnea   Objective:  Filed Vitals:   02/14/13 2130 02/15/13 0521  BP: 123/80 137/61  Pulse: 71 79  Temp: 97.9 F (36.6 C) 98 F (36.7 C)  TempSrc: Oral Oral  Resp: 18 18  Height: 5' 5" (1.651 m)   Weight: 269 lb 6.4 oz (122.2 kg)   SpO2: 95% 95%    Intake/Output from previous day: No intake or output data in the 24 hours ending 02/15/13 0843  Physical Exam: Affect appropriate Obese white female  HEENT: normal Neck supple with no adenopathy JVP normal no bruits no thyromegaly Lungs clear with no wheezing and good diaphragmatic motion Heart:  S1/S2 no murmur, no rub, gallop or click PMI normal Abdomen: benighn, BS positve, no tenderness, no AAA no bruit.  No HSM or HJR Distal pulses intact with no bruits No edema Neuro non-focal Skin warm and dry No muscular weakness   Lab Results: Basic Metabolic Panel:  Recent Labs  02/15/13 0505  NA 138  K 3.7  CL 100  CO2 28  GLUCOSE 100*  BUN 11  CREATININE 0.84  CALCIUM 8.3*   CBC:  Recent Labs  02/15/13 0505  WBC 8.6  HGB 12.1  HCT 37.5  MCV 94.5  PLT 241   Fasting Lipid Panel:  Recent Labs  02/15/13 0500  CHOL 125  HDL 49  LDLCALC 31  TRIG 227*  CHOLHDL 2.6    Imaging: No results found.  Cardiac Studies:  ECG:    Telemetry:  NSR no arrhythmia 02/15/2013   Echo:   Medications:   . aspirin EC  325 mg Oral Once  . [START ON 02/16/2013] aspirin EC  81 mg Oral Daily  . metoprolol  50 mg Oral Daily  . simvastatin  40 mg Oral QHS  . sodium chloride  3 mL Intravenous Q12H     . sodium chloride      Assessment/Plan:  HTN:  Continue beta blocker consider adding ACE Chol:  With CAD continue statin LDL at goal 77 CAD:  Stent to diagonal in 2009  Moderate disease circumflex/LAD  myovue with ? Anterior ischemia at [place] Cath today with Dr Jordan  Raywood Wailes 02/15/2013, 8:43 AM     

## 2013-02-16 ENCOUNTER — Encounter (HOSPITAL_COMMUNITY): Payer: Self-pay | Admitting: Physician Assistant

## 2013-02-16 DIAGNOSIS — I259 Chronic ischemic heart disease, unspecified: Secondary | ICD-10-CM

## 2013-02-16 DIAGNOSIS — E871 Hypo-osmolality and hyponatremia: Secondary | ICD-10-CM | POA: Diagnosis present

## 2013-02-16 MED ORDER — METOPROLOL SUCCINATE ER 50 MG PO TB24: 50.0000 mg | ORAL_TABLET | Freq: Every day | ORAL | Status: DC

## 2013-02-16 MED ORDER — NITROGLYCERIN 0.4 MG SL SUBL: 0.4000 mg | SUBLINGUAL_TABLET | SUBLINGUAL | Status: DC | PRN

## 2013-02-16 NOTE — Progress Notes (Signed)
Patient discharge teaching given, including activity, diet, follow-up appoints, and medications. Patient verbalized understanding of all discharge instructions. IV access was d/c'd. Vitals are stable. Skin is intact except as charted in most recent assessments. Pt to be escorted out by NT, to be driven home by family.  Myron Stankovich, MBA, BS, RN 

## 2013-02-16 NOTE — Discharge Summary (Signed)
Discharge Summary   Patient ID: Tara Love MRN: [id number], DOB/AGE: [birth year] 72 y.o. Admit date: 02/14/2013 D/C date:     02/16/2013  Primary Cardiologist: Purvis Sheffield  Primary Discharge Diagnoses:  1. CAD with chest pain this admission - abnormal MPI at [facility] [place]  -> nonobstructive by cath this admission (30% LAD after takeoff of D1, eccentric 50-70% just prox to patent previous stent, 30% mLCx, mild irregularities <20% RCA), unchanged from prior cath - history: prior DES to diag 06/2007 2. HTN 3. Hyperlipidemia 4. Morbid obesity BMI 44.9 5. Mild hyponatremia - Na 132, HCTZ discontinued  Secondary Discharge Diagnoses:  1. IBS 2. Unspecified asthma 3. Iron deficiency anemia, unspecified 4. Carotid artery disease  Hospital Course: Tara Love is a 72 y/o F with history of CAD s/p PCI 06/2007, HTN, morbid obesity, HLD, who presented to [facility] initially on Saturday 02/12/13 with chest pain. She has occasional angina relieved with SL NTG. 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 She sought emergent care at [facility] and continued to have chest pain. It was finally relieved in the ER with IV narcotics. She had negative enzymes, normal BNP and negative d-dimer. She underwent MPI which demonstrated a reversible defect in the anterior and lateral walls. Echo also performed per the notes and she was found to have LVH with normal EF, no valvular or wall motion abnormalities. Due to the abnormal stress test, she was transferred to [facility] for cardiac cath. This demonstrated nonobstructive CAD with no significant change compared to last study in 2010. EF was 55-60% without significant mitral regurgitation. Continued medical therapy was recommended. Today she feels well. Labs are stable. Dr. [place] Emms has seen and examined the patient today and feels she is stable for  discharge.  Of note, HCTZ was held this admission due to mild hyponatremia 132 & in prep for cath. Her BP has remained in a range of 98/67-128/54 without this. We will continue to hold this at discharge. Could consider adding ACEI if needed in the future. She was taking Lopressor 50mg  qday and this was changed to Toprol XL 50mg  qday for longer coverage.  Discharge Vitals: Blood pressure 128/54, pulse 81, temperature 97.7 F (36.5 C), temperature source Oral, resp. rate 18, height 5\' 5"  (1.651 m), weight 269 lb 6.4 oz (122.2 kg), SpO2 96.00%.  Labs: Lab Results  Component Value Date   WBC 8.6 02/15/2013   HGB 12.1 02/15/2013   HCT 37.5 02/15/2013   MCV 94.5 02/15/2013   PLT 241 02/15/2013     Recent Labs Lab 02/15/13 0505  NA 138  K 3.7  CL 100  CO2 28  BUN 11  CREATININE 0.84  CALCIUM 8.3*  GLUCOSE 100*    Lab Results  Component Value Date   CHOL 125 02/15/2013   HDL 49 02/15/2013   LDLCALC 31 02/15/2013   TRIG 227* 02/15/2013    [facility]: D-dimer  <0.27. BNP WNL at 38. Troponin 0.01 by labwork sent over. Per dc summary "her isoenzymes were negative."  Diagnostic Studies/Procedures   Cath 02/15/13 Procedure: Left Heart Cath, Selective Coronary Angiography, LV angiography  Indication: 72 yo WF with history of HTN and CAD s/p stenting of the diagonal in 2009 presents with symptoms of chest pain. Myoview study is suggestive of anterior ischemia.  Procedural Details: The right wrist was prepped, draped, and anesthetized with 1% lidocaine.  Using the modified Seldinger technique, a 5 French sheath was introduced into the right radial artery. 3 mg of verapamil was administered through the sheath, weight-based unfractionated heparin was administered intravenously. Standard Judkins catheters were used for selective coronary angiography and left ventriculography. Catheter exchanges were performed over an exchange length guidewire. There were no immediate procedural complications. A TR  band was used for radial hemostasis at the completion of the procedure. The patient was transferred to the post catheterization recovery area for further monitoring.  Procedural Findings:  Hemodynamics:  AO 117/60 mean 87 mm Hg  LV 117/17 mm Hg  Coronary angiography:  Coronary dominance: right  Left mainstem: Normal.  Left anterior descending (LAD): 30% immediately after takeoff of the first diagonal. The first diagonal stent is widely patent. There is an eccentric 50-70% stenosis just proximal to the stent.  Left circumflex (LCx): 30% mid vessel.  Right coronary artery (RCA): moderately calcified. Mild irregularities less than 20%.  Left ventriculography: Left ventricular systolic function is normal, LVEF is estimated at 55-65%, there is no significant mitral regurgitation  Final Conclusions:  1. Nonobstructive CAD. No significant change compared to last study in 2010.  2. Normal LV function.  Recommendations: Continue medical Rx.  CXR: cardiomegaly and hyperinflation/chronic interstitial thickening. No acute process.  Echo & Stress Test - please see HPI  Discharge Medications     Medication List    STOP taking these medications       hydrochlorothiazide 25 MG tablet  Commonly known as:  HYDRODIURIL     metoprolol 50 MG tablet  Commonly known as:  LOPRESSOR      TAKE these medications       acetaminophen 500 MG tablet  Commonly known as:  TYLENOL  Take 500 mg by mouth every 6 (six) hours as needed for pain. For pain in back and knee     aspirin EC 81 MG tablet  Take 81 mg by mouth daily.     furosemide 20 MG tablet  Commonly known as:  LASIX  Take 20 mg by mouth daily as needed for fluid.     metoprolol succinate 50 MG 24 hr tablet  Commonly known as:  TOPROL-XL  Take 1 tablet (50 mg total) by mouth daily.  Start taking on:  02/17/2013     nitroGLYCERIN 0.4 MG SL tablet  Commonly known as:  NITROSTAT  Place 1 tablet (0.4 mg total) under the tongue every 5 (five)  minutes as needed for chest pain (up to 3 doses).     simvastatin 40 MG tablet  Commonly known as:  ZOCOR  Take 40 mg by mouth at bedtime.        Disposition   The patient will be discharged in stable condition to home. Discharge Orders   Future Appointments Provider Department Dept Phone   03/17/2013 10:40 AM Laqueta Linden, MD [facility] [phone]   Future Orders Complete By Expires   Diet - low sodium heart healthy  As directed    Discharge instructions  As directed    Comments:     Your metoprolol was changed to a longer-acting version.  Your HCTZ was stopped because your sodium was low when you came into the hospital.  Please monitor your blood pressure occasionally at home. Call your doctor if you tend to get readings of greater than 130 on the top number or 80 on the bottom number.   Increase activity slowly  As directed  Comments:     No driving for 2 days. No lifting over 5 lbs for 1 week. No sexual activity for 1 week. Keep procedure site clean & dry. If you notice increased pain, swelling, bleeding or pus, call/return!  You may shower, but no soaking baths/hot tubs/pools for 1 week.     Follow-up Information   Follow up with Laqueta Linden, MD. [facility] [place] office - 03/17/13 at 10:40am)    Specialty:  Cardiology   Contact information:   [address] [place] [address] [phone]         Duration of Discharge Encounter: Greater than 30 minutes including physician and PA time.  Signed, Kriste Basque Patrici Minnis PA-C 02/16/2013, 1:18 PM

## 2013-02-16 NOTE — Progress Notes (Signed)
  Patient ID: Tara Love, female   DOB: [birth year], 72 y.o.   MRN: [id number]    Subjective:  Denies SSCP, palpitations or Dyspnea   Objective:  Filed Vitals:   02/15/13 1500 02/15/13 1530 02/15/13 2112 02/16/13 0719  BP: 98/67 104/50 119/60 108/66  Pulse: 82 83 66 77  Temp:   98.7 F (37.1 C) 97.7 F (36.5 C)  TempSrc:    Oral  Resp:      Height:      Weight:      SpO2: 97%  97% 96%    Intake/Output from previous day:  Intake/Output Summary (Last 24 hours) at 02/16/13 0746 Last data filed at 02/15/13 1120  Gross per 24 hour  Intake    360 ml  Output      0 ml  Net    360 ml    Physical Exam: Affect appropriate Obese white female  HEENT: normal Neck supple with no adenopathy JVP normal no bruits no thyromegaly Lungs clear with no wheezing and good diaphragmatic motion Heart:  S1/S2 no murmur, no rub, gallop or click PMI normal Abdomen: benighn, BS positve, no tenderness, no AAA no bruit.  No HSM or HJR Distal pulses intact with no bruits No edema Neuro non-focal Skin warm and dry No muscular weakness Rigth radial A    Lab Results: Basic Metabolic Panel:  Recent Labs  40/98/11 0505  NA 138  K 3.7  CL 100  CO2 28  GLUCOSE 100*  BUN 11  CREATININE 0.84  CALCIUM 8.3*   CBC:  Recent Labs  02/15/13 0505  WBC 8.6  HGB 12.1  HCT 37.5  MCV 94.5  PLT 241   Fasting Lipid Panel:  Recent Labs  02/15/13 0500  CHOL 125  HDL 49  LDLCALC 31  TRIG 227*  CHOLHDL 2.6    Imaging: No results found.  Cardiac Studies:  ECG:    Telemetry:  NSR no arrhythmia 02/16/2013   Echo:   Medications:   . aspirin EC  81 mg Oral Daily  . metoprolol  50 mg Oral Daily  . simvastatin  40 mg Oral QHS        Assessment/Plan:  HTN:  Continue beta blocker consider adding ACE Chol:  With CAD continue statin LDL at goal 77 CAD:  Stent to diagonal in 2009  Moderate disease circumflex/LAD  myovue with ? Anterior ischemia at [facility] by [place]  yesterday with no flow limiting disease.  D/C home today F/U in [place].  Continue ASA and beta blocker  Charlton Haws 02/16/2013, 7:46 AM

## 2013-03-17 ENCOUNTER — Ambulatory Visit (INDEPENDENT_AMBULATORY_CARE_PROVIDER_SITE_OTHER): Payer: Medicare Other | Admitting: Cardiovascular Disease

## 2013-03-17 ENCOUNTER — Encounter: Payer: Self-pay | Admitting: Cardiovascular Disease

## 2013-03-17 VITALS — BP 128/83 | HR 73 | Ht 65.0 in | Wt 266.0 lb

## 2013-03-17 DIAGNOSIS — I209 Angina pectoris, unspecified: Secondary | ICD-10-CM

## 2013-03-17 DIAGNOSIS — E785 Hyperlipidemia, unspecified: Secondary | ICD-10-CM

## 2013-03-17 DIAGNOSIS — I2510 Atherosclerotic heart disease of native coronary artery without angina pectoris: Secondary | ICD-10-CM

## 2013-03-17 DIAGNOSIS — I259 Chronic ischemic heart disease, unspecified: Secondary | ICD-10-CM

## 2013-03-17 DIAGNOSIS — I10 Essential (primary) hypertension: Secondary | ICD-10-CM

## 2013-03-17 MED ORDER — ISOSORBIDE MONONITRATE ER 30 MG PO TB24: 30.0000 mg | ORAL_TABLET | Freq: Every morning | ORAL | Status: DC

## 2013-03-17 NOTE — Patient Instructions (Signed)
   Begin Imdur 30mg  every morning - new sent to pharm Continue all other medications.   Your physician wants you to follow up in:  4 months.  You will receive a reminder letter in the mail one-two months in advance.  If you don't receive a letter, please call our office to schedule the follow up appointment

## 2013-03-17 NOTE — Progress Notes (Signed)
Patient ID: MYLISA BRUNSON, female   DOB: 1940/05/31, 72 y.o.   MRN: [id number]      SUBJECTIVE: Mrs. Melin recently presented to [facility] with chest pain. She ruled out for an ACS and underwent stress testing and echocardiography. She had a reversible defect, and was then sent to [facility] for cardiac cath, which demonstrated nonobstructive CAD with no significant change compared to last study in 2010. EF was 55-60% without significant mitral regurgitation. Continued medical therapy was recommended. Her HCTZ was stopped and her short-acting metoprolol was switched to Toprol-XL.  She has shortness of breath which is chronic and related to asthma, but is relieved to a great degree with inhalers. She continues to experience chest pain.   Allergies  Allergen Reactions  . Codeine Phosphate     REACTION: nausea  . Morphine Sulfate     REACTION: nausea    Current Outpatient Prescriptions  Medication Sig Dispense Refill  . acetaminophen (TYLENOL) 500 MG tablet Take 500 mg by mouth every 6 (six) hours as needed for pain. For pain in back and knee      . aspirin EC 81 MG tablet Take 81 mg by mouth daily.        . furosemide (LASIX) 20 MG tablet Take 20 mg by mouth daily as needed for fluid.       . metoprolol succinate (TOPROL-XL) 50 MG 24 hr tablet Take 1 tablet (50 mg total) by mouth daily.  30 tablet  6  . nitroGLYCERIN (NITROSTAT) 0.4 MG SL tablet Place 1 tablet (0.4 mg total) under the tongue every 5 (five) minutes as needed for chest pain (up to 3 doses).      . simvastatin (ZOCOR) 40 MG tablet Take 40 mg by mouth at bedtime.         No current facility-administered medications for this visit.    Past Medical History  Diagnosis Date  . Unspecified essential hypertension   . Other and unspecified hyperlipidemia   . CAD (coronary artery disease)     a. DES to diag 06/2007. b. Cath 01/2013 due to abnormal nuc at [facility]: nonobst dz, patent stent, EF 55-65%.  . Morbid obesity   .  Irritable bowel syndrome   . Unspecified asthma(493.90)   . Iron deficiency anemia, unspecified     Past Surgical History  Procedure Laterality Date  . Tonsillectomy      as a child  . Dilation and curettage of uterus    . Cholecystectomy open  1987  . Gastric bypass surgery  1993    Subtotal gastrectomy  . Carpal tunnel release      Bilateral  . Incisional hernia repair  01/25/2003    with mesh    History   Social History  . Marital Status: Divorced    Spouse Name: N/A    Number of Children: 2  . Years of Education: N/A   Occupational History  . RETIRED    Social History Main Topics  . Smoking status: Former Smoker -- 2.00 packs/day for 18 years    Types: Cigarettes    Quit date: 05/19/1965  . Smokeless tobacco: Former Neurosurgeon    Types: Snuff, Dorna Bloom    Quit date: 05/19/1946     Comment: Started smoking at age 17-has not smoked in 40 years/dipped snuff and chewed tobacco during childhood  . Alcohol Use: No  . Drug Use: No  . Sexual Activity: Not on file   Other Topics Concern  . Not on  file   Social History Narrative  . No narrative on file     Filed Vitals:   03/17/13 1037  BP: 128/83  Pulse: 73  Height: 5\' 5"  (1.651 m)  Weight: 266 lb (120.657 kg)    PHYSICAL EXAM General: NAD Neck: No JVD, no thyromegaly or thyroid nodule.  Lungs: Clear to auscultation bilaterally with normal respiratory effort. CV: Nondisplaced PMI.  Heart regular S1/S2, no S3/S4, no murmur.  No peripheral edema.  No carotid bruit.  Normal pedal pulses.  Abdomen: Soft, nontender, no hepatosplenomegaly, no distention.  Neurologic: Alert and oriented x 3.  Psych: Normal affect. Extremities: No clubbing or cyanosis.   ECG: reviewed and available in electronic records.  Cath results: Hemodynamics:  AO 117/60 mean 87 mm Hg  LV 117/17 mm Hg  Coronary angiography:  Coronary dominance: right  Left mainstem: Normal.  Left anterior descending (LAD): 30% immediately after takeoff of the  first diagonal. The first diagonal stent is widely patent. There is an eccentric 50-70% stenosis just proximal to the stent.  Left circumflex (LCx): 30% mid vessel.  Right coronary artery (RCA): moderately calcified. Mild irregularities less than 20%.  Left ventriculography: Left ventricular systolic function is normal, LVEF is estimated at 55-65%, there is no significant mitral regurgitation  Final Conclusions:  1. Nonobstructive CAD. No significant change compared to last study in 2010.  2. Normal LV function.    ASSESSMENT AND PLAN: 1. CAD: recent cath demonstrated nonobstructive disease and a patent stent. Because she continues to experience chest pain, I will start Imdur 30 mg daily which should also help with microvascular ischemia. Continue ASA, Toprol-XL, and simvastatin. 2. HTN: controlled on current therapy. 3. Hyperlipidemia: on statin.   Prentice Docker, M.D., F.A.C.C.

## 2013-06-27 ENCOUNTER — Encounter: Payer: Self-pay | Admitting: Cardiovascular Disease

## 2013-06-27 ENCOUNTER — Ambulatory Visit (INDEPENDENT_AMBULATORY_CARE_PROVIDER_SITE_OTHER): Payer: Medicare Other | Admitting: Cardiovascular Disease

## 2013-06-27 VITALS — BP 167/86 | HR 69 | Ht 65.0 in | Wt 270.0 lb

## 2013-06-27 DIAGNOSIS — I2510 Atherosclerotic heart disease of native coronary artery without angina pectoris: Secondary | ICD-10-CM

## 2013-06-27 DIAGNOSIS — I209 Angina pectoris, unspecified: Secondary | ICD-10-CM

## 2013-06-27 DIAGNOSIS — I10 Essential (primary) hypertension: Secondary | ICD-10-CM

## 2013-06-27 DIAGNOSIS — E785 Hyperlipidemia, unspecified: Secondary | ICD-10-CM

## 2013-06-27 NOTE — Patient Instructions (Signed)
Continue all current medications. Nurse visit in 2 weeks to follow up on elevated blood pressure in office today Your physician wants you to follow up in: 6 months.  You will receive a reminder letter in the mail one-two months in advance.  If you don't receive a letter, please call our office to schedule the follow up appointment

## 2013-06-27 NOTE — Progress Notes (Signed)
Patient ID: Tara Love, female   DOB: July 21, 1940, 73 y.o.   MRN: [id number]      SUBJECTIVE: The patient is a 73 year old woman with a history of chest pain, coronary artery disease with prior PCI, hypertension, hyperlipidemia , obesity, and asthma. After starting indoor at her last visit, her chest pain is under much better control. She says she's feeling very well and denies shortness of breath, palpitations, lightheadedness and dizziness.    Allergies  Allergen Reactions  . Codeine Phosphate     REACTION: nausea  . Morphine Sulfate     REACTION: nausea    Current Outpatient Prescriptions  Medication Sig Dispense Refill  . acetaminophen (TYLENOL) 500 MG tablet Take 500 mg by mouth every 6 (six) hours as needed for pain. For pain in back and knee      . aspirin EC 81 MG tablet Take 81 mg by mouth daily.        . furosemide (LASIX) 20 MG tablet Take 20 mg by mouth daily as needed for fluid.       . isosorbide mononitrate (IMDUR) 30 MG 24 hr tablet Take 1 tablet (30 mg total) by mouth every morning.  30 tablet  6  . metoprolol succinate (TOPROL-XL) 50 MG 24 hr tablet Take 1 tablet (50 mg total) by mouth daily.  30 tablet  6  . nitroGLYCERIN (NITROSTAT) 0.4 MG SL tablet Place 1 tablet (0.4 mg total) under the tongue every 5 (five) minutes as needed for chest pain (up to 3 doses).      . simvastatin (ZOCOR) 40 MG tablet Take 40 mg by mouth at bedtime.         No current facility-administered medications for this visit.    Past Medical History  Diagnosis Date  . Unspecified essential hypertension   . Other and unspecified hyperlipidemia   . CAD (coronary artery disease)     a. DES to [place] 06/2007. b. Cath 01/2013 due to abnormal nuc at [facility]: nonobst dz, patent stent, EF 55-65%.  . Morbid obesity   . Irritable bowel syndrome   . Unspecified asthma(493.90)   . Iron deficiency anemia, unspecified     Past Surgical History  Procedure Laterality Date  . Tonsillectomy      as  a child  . Dilation and curettage of uterus    . Cholecystectomy open  1987  . Gastric bypass surgery  1993    Subtotal gastrectomy  . Carpal tunnel release      Bilateral  . Incisional hernia repair  01/25/2003    with mesh    History   Social History  . Marital Status: Divorced    Spouse Name: N/A    Number of Children: 2  . Years of Education: N/A   Occupational History  . RETIRED    Social History Main Topics  . Smoking status: Former Smoker -- 2.00 packs/day for 18 years    Types: Cigarettes    Quit date: 05/19/1965  . Smokeless tobacco: Former Systems developer    Types: Snuff, Sarina Ser    Quit date: 05/19/1946     Comment: Started smoking at age 18-has not smoked in 67 years/dipped snuff and chewed tobacco during childhood  . Alcohol Use: No  . Drug Use: No  . Sexual Activity: Not on file   Other Topics Concern  . Not on file   Social History Narrative  . No narrative on file     Filed Vitals:   06/27/13 1255  BP: 167/86  Pulse: 69  Height: 5\' 5"  (1.651 m)  Weight: 270 lb (122.471 kg)    PHYSICAL EXAM General: NAD Neck: No JVD, no thyromegaly or thyroid nodule.  Lungs: Clear to auscultation bilaterally with normal respiratory effort. CV: Nondisplaced PMI.  Heart regular S1/S2, no S3/S4, no murmur.  No peripheral edema.  No carotid bruit.  Normal pedal pulses.  Abdomen: Soft, nontender, no hepatosplenomegaly, no distention.  Neurologic: Alert and oriented x 3.  Psych: Normal affect. Extremities: No clubbing or cyanosis.   ECG: reviewed and available in electronic records.      ASSESSMENT AND PLAN: 1. CAD: recent cath demonstrated nonobstructive disease and a patent stent. At her last visit because of continued chest pain, I started Imdur 30 mg daily to help with microvascular ischemia. Since then, she has done very well. Continue ASA, Toprol-XL, and simvastatin.  2. HTN: uncontrolled on current therapy, but it has reportedly been reasonable at other office  visits. I will have her return to have her BP checked with a nurse to see if further medication titration is warranted. 3. Hyperlipidemia: on statin. HDL 49, LDL 31, TG 227 in 01/2013.    Kate Sable, M.D., F.A.C.C.

## 2013-08-08 ENCOUNTER — Telehealth: Payer: Self-pay | Admitting: Cardiovascular Disease

## 2013-08-08 NOTE — Telephone Encounter (Signed)
Left message to return call 

## 2013-08-08 NOTE — Telephone Encounter (Signed)
Patient was told to call in her blood pressure reading by Dr. Bronson Ing.  She had this taken yesterday 08/07/13---reading was 130/88---heart rate was 105.   Please advise patient any action that needs to be taken.

## 2013-08-09 ENCOUNTER — Ambulatory Visit (INDEPENDENT_AMBULATORY_CARE_PROVIDER_SITE_OTHER): Payer: Medicare Other | Admitting: *Deleted

## 2013-08-09 VITALS — BP 116/72 | HR 75

## 2013-08-09 DIAGNOSIS — I10 Essential (primary) hypertension: Secondary | ICD-10-CM

## 2013-08-10 NOTE — Telephone Encounter (Signed)
Patient stopped by office yesterday morning.  Stated she was doing blood work next door & took chance at stopping by instead of calling back.   Discussed BP & HR reading below.  Informed her that I had looked back through her chart and did not find any other elevated heart rate readings.  Stated her daughter took reading as she does not have a machine at home.  Stated she had not been up doing any activity prior to reading.  BP was checked here in the office after sitting 5-10 minutes.  BP was 116/72 & HR was 75.    Informed patient that message will be sent to provider for advice.  Patient verbalized understanding.

## 2013-08-10 NOTE — Telephone Encounter (Signed)
I would continue present medical management for the time being.

## 2013-08-10 NOTE — Progress Notes (Signed)
In office for bp check.  No complaints at present.

## 2013-08-15 NOTE — Telephone Encounter (Signed)
Patient notified and verbalized understanding. 

## 2013-12-29 ENCOUNTER — Encounter: Payer: Self-pay | Admitting: Cardiovascular Disease

## 2013-12-29 ENCOUNTER — Ambulatory Visit (INDEPENDENT_AMBULATORY_CARE_PROVIDER_SITE_OTHER): Payer: Medicare Other | Admitting: Cardiovascular Disease

## 2013-12-29 VITALS — BP 125/83 | HR 80 | Ht 65.0 in | Wt 268.0 lb

## 2013-12-29 DIAGNOSIS — I2510 Atherosclerotic heart disease of native coronary artery without angina pectoris: Secondary | ICD-10-CM

## 2013-12-29 DIAGNOSIS — L989 Disorder of the skin and subcutaneous tissue, unspecified: Secondary | ICD-10-CM

## 2013-12-29 DIAGNOSIS — I10 Essential (primary) hypertension: Secondary | ICD-10-CM

## 2013-12-29 DIAGNOSIS — E785 Hyperlipidemia, unspecified: Secondary | ICD-10-CM

## 2013-12-29 DIAGNOSIS — I25119 Atherosclerotic heart disease of native coronary artery with unspecified angina pectoris: Secondary | ICD-10-CM

## 2013-12-29 DIAGNOSIS — I209 Angina pectoris, unspecified: Secondary | ICD-10-CM

## 2013-12-29 MED ORDER — ISOSORBIDE MONONITRATE ER 60 MG PO TB24: 60.0000 mg | ORAL_TABLET | Freq: Every day | ORAL | Status: DC

## 2013-12-29 MED ORDER — METOPROLOL SUCCINATE ER 50 MG PO TB24: ORAL_TABLET | ORAL | Status: DC

## 2013-12-29 NOTE — Patient Instructions (Addendum)
   Increase Toprol XL - Take 50mg  in the morning and 25mg  in the evening.  Increase Imdur to 60mg  daily. Continue all other medications.   Labs for Lipids and LFT to be done. Your physician wants you to follow up in: 6 months.  You will receive a reminder letter in the mail one-two months in advance.  If you don't receive a letter, please call our office to schedule the follow up appointment.

## 2013-12-29 NOTE — Progress Notes (Signed)
Patient ID: Tara Love, female   DOB: 1940/11/30, 73 y.o.   MRN: [id number]      SUBJECTIVE: The patient is a 73 year old woman with a history of chest pain, coronary artery disease with prior PCI, essential hypertension, hyperlipidemia , obesity, and asthma. After starting Imdur at a prior visit, her chest pain had been under much better control. However, she has been now experiencing a dull intermittent chest discomfort on a daily basis. She has a high tolerance for pain. She has not taken SL nitro. She otherwise feels very well and denies shortness of breath, palpitations, lightheadedness and dizziness. She has a skin lesion on her left forearm which has become pruritic and erythematous.   Review of Systems: As per "subjective", otherwise negative.  Allergies  Allergen Reactions  . Codeine Phosphate     REACTION: nausea  . Morphine Sulfate     REACTION: nausea    Current Outpatient Prescriptions  Medication Sig Dispense Refill  . acetaminophen (TYLENOL) 500 MG tablet Take 500 mg by mouth every 6 (six) hours as needed for pain. For pain in back and knee      . aspirin EC 81 MG tablet Take 81 mg by mouth daily.        . furosemide (LASIX) 20 MG tablet Take 20 mg by mouth daily as needed for fluid.       . isosorbide mononitrate (IMDUR) 30 MG 24 hr tablet Take 1 tablet (30 mg total) by mouth every morning.  30 tablet  6  . metoprolol succinate (TOPROL-XL) 50 MG 24 hr tablet Take 1 tablet (50 mg total) by mouth daily.  30 tablet  6  . nitroGLYCERIN (NITROSTAT) 0.4 MG SL tablet Place 1 tablet (0.4 mg total) under the tongue every 5 (five) minutes as needed for chest pain (up to 3 doses).      . simvastatin (ZOCOR) 40 MG tablet Take 40 mg by mouth at bedtime.         No current facility-administered medications for this visit.    Past Medical History  Diagnosis Date  . Unspecified essential hypertension   . Other and unspecified hyperlipidemia   . CAD (coronary artery disease)      a. DES to [place] 06/2007. b. Cath 01/2013 due to abnormal nuc at [facility]: nonobst dz, patent stent, EF 55-65%.  . Morbid obesity   . Irritable bowel syndrome   . Unspecified asthma(493.90)   . Iron deficiency anemia, unspecified     Past Surgical History  Procedure Laterality Date  . Tonsillectomy      as a child  . Dilation and curettage of uterus    . Cholecystectomy open  1987  . Gastric bypass surgery  1993    Subtotal gastrectomy  . Carpal tunnel release      Bilateral  . Incisional hernia repair  01/25/2003    with mesh    History   Social History  . Marital Status: Divorced    Spouse Name: N/A    Number of Children: 2  . Years of Education: N/A   Occupational History  . RETIRED    Social History Main Topics  . Smoking status: Former Smoker -- 2.00 packs/day for 18 years    Types: Cigarettes    Quit date: 05/19/1965  . Smokeless tobacco: Former Systems developer    Types: Snuff, Sarina Ser    Quit date: 05/19/1946     Comment: Started smoking at age 81-has not smoked in 27 years/dipped snuff and  chewed tobacco during childhood  . Alcohol Use: No  . Drug Use: No  . Sexual Activity: Not on file   Other Topics Concern  . Not on file   Social History Narrative  . No narrative on file     Filed Vitals:   12/29/13 1257  Height: 5\' 5"  (1.651 m)  Weight: 268 lb (121.564 kg)   BP 125/83  Pulse 80   Wt 268 lbs  PHYSICAL EXAM General: NAD Neck: No JVD, no thyromegaly. Lungs: Clear to auscultation bilaterally with normal respiratory effort. CV: Nondisplaced PMI.  Regular rate and rhythm, normal S1/S2, no S3/S4, no murmur. No pretibial or periankle edema.  No carotid bruit.  Normal pedal pulses.  Abdomen: Soft, nontender, no hepatosplenomegaly, no distention.  Neurologic: Alert and oriented x 3.  Psych: Normal affect. Extremities: No clubbing or cyanosis. Erythematous lesion on dorsum of left forearm/wrist, slightly raised.  ECG: reviewed and available in electronic  records.      ASSESSMENT AND PLAN: 1. CAD with chest discomfort: Cath on 02/15/13 demonstrated nonobstructive disease and a patent stent. At a prior visit because of continued chest pain, I started Imdur 30 mg daily to help with microvascular ischemia. Given her daily chest pain, I will increase Toprol-XL to 50 mg q am and 25 mg q pm, and increase Imdur to 60 mg daily. Continue ASA, Toprol-XL, and simvastatin.   2. HTN: Controlled on present therapy.  3. Hyperlipidemia: Check lipids and LFT's. On simvastatin 40 mg.  4. Skin lesion: I recommended she see a dermatologist to have it biopsied, to make sure this does not represent some form of carcinoma.  Dispo: f/u 6 months.  Kate Sable, M.D., F.A.C.C.

## 2014-01-12 ENCOUNTER — Telehealth: Payer: Self-pay | Admitting: *Deleted

## 2014-01-12 ENCOUNTER — Encounter: Payer: Self-pay | Admitting: *Deleted

## 2014-01-12 NOTE — Telephone Encounter (Signed)
Patient was mailed diet control information. Address confirmed w pt

## 2014-01-12 NOTE — Telephone Encounter (Signed)
Message copied by Orion Modest on Thu Jan 12, 2014 11:13 AM ------      Message from: Kate Sable A      Created: Wed Jan 11, 2014  3:58 PM       Mildly elevated TG, otherwise good results. Diet control recommended. ------

## 2014-01-12 NOTE — Telephone Encounter (Signed)
Pt notified of results.  She mentioned you referred her to a Dermotologist regarding the spot on her left arm. Stated her results came back positive for skin cancer. She stated she has an appointment for laser removal.

## 2014-04-27 ENCOUNTER — Encounter (HOSPITAL_COMMUNITY): Payer: Self-pay | Admitting: Cardiology

## 2014-06-27 ENCOUNTER — Ambulatory Visit: Payer: Medicare Other | Admitting: Cardiovascular Disease

## 2014-07-14 ENCOUNTER — Ambulatory Visit: Payer: Medicare Other | Admitting: Cardiovascular Disease

## 2014-07-25 DIAGNOSIS — Z85828 Personal history of other malignant neoplasm of skin: Secondary | ICD-10-CM | POA: Diagnosis not present

## 2014-08-09 DIAGNOSIS — M1712 Unilateral primary osteoarthritis, left knee: Secondary | ICD-10-CM | POA: Diagnosis not present

## 2014-08-09 DIAGNOSIS — I10 Essential (primary) hypertension: Secondary | ICD-10-CM | POA: Diagnosis not present

## 2014-08-09 DIAGNOSIS — N183 Chronic kidney disease, stage 3 (moderate): Secondary | ICD-10-CM | POA: Diagnosis not present

## 2014-08-09 DIAGNOSIS — E782 Mixed hyperlipidemia: Secondary | ICD-10-CM | POA: Diagnosis not present

## 2014-08-09 DIAGNOSIS — I2510 Atherosclerotic heart disease of native coronary artery without angina pectoris: Secondary | ICD-10-CM | POA: Diagnosis not present

## 2014-08-14 ENCOUNTER — Encounter: Payer: Self-pay | Admitting: Cardiovascular Disease

## 2014-08-14 ENCOUNTER — Ambulatory Visit (INDEPENDENT_AMBULATORY_CARE_PROVIDER_SITE_OTHER): Payer: Medicare Other | Admitting: Cardiovascular Disease

## 2014-08-14 VITALS — BP 132/82 | HR 74 | Ht 65.0 in | Wt 278.0 lb

## 2014-08-14 DIAGNOSIS — I10 Essential (primary) hypertension: Secondary | ICD-10-CM | POA: Diagnosis not present

## 2014-08-14 DIAGNOSIS — E785 Hyperlipidemia, unspecified: Secondary | ICD-10-CM

## 2014-08-14 DIAGNOSIS — M79602 Pain in left arm: Secondary | ICD-10-CM

## 2014-08-14 DIAGNOSIS — I25119 Atherosclerotic heart disease of native coronary artery with unspecified angina pectoris: Secondary | ICD-10-CM

## 2014-08-14 MED ORDER — METOPROLOL SUCCINATE ER 50 MG PO TB24: 50.0000 mg | ORAL_TABLET | Freq: Every morning | ORAL | Status: DC

## 2014-08-14 NOTE — Patient Instructions (Signed)
   Continue Toprol at 50mg  every morning. Continue all other medications.   Your physician wants you to follow up in:  1 year.  You will receive a reminder letter in the mail one-two months in advance.  If you don't receive a letter, please call our office to schedule the follow up appointment

## 2014-08-14 NOTE — Progress Notes (Signed)
Patient ID: Tara Love, female   DOB: Aug 10, 1940, 74 y.o.   MRN: [id number]      SUBJECTIVE: The patient is a 74 year old woman with a history of chest pain, coronary artery disease with prior PCI, essential hypertension, hyperlipidemia , obesity, and asthma. She has been doing well with regards to chest pain. The pharmacy only gave her Toprol-XL 50 mg daily and she has taken this without problems. It appears the increase of Imdur to 60 mg daily alleviated her chest pain. In December she had been opening the car door and felt sudden sharp pain in her left arm and she has had problems lifting this arm ever since. She is scheduled to see her primary care physician, Dr. Quillian Quince, tomorrow to have this further evaluated. ECG performed the office today demonstrated normal sinus rhythm with no ischemic ST segment or T-wave abnormalities.   Review of Systems: As per "subjective", otherwise negative.  Allergies  Allergen Reactions  . Codeine Phosphate     REACTION: nausea  . Morphine Sulfate     REACTION: nausea    Current Outpatient Prescriptions  Medication Sig Dispense Refill  . acetaminophen (TYLENOL) 500 MG tablet Take 500 mg by mouth every 6 (six) hours as needed for pain. For pain in back and knee    . aspirin EC 81 MG tablet Take 81 mg by mouth daily.      . furosemide (LASIX) 20 MG tablet Take 20 mg by mouth daily as needed for fluid.     . isosorbide mononitrate (IMDUR) 60 MG 24 hr tablet Take 1 tablet (60 mg total) by mouth daily. 30 tablet 6  . magnesium oxide (MAG-OX) 400 MG tablet Take 400 mg by mouth daily.    . metoprolol succinate (TOPROL-XL) 50 MG 24 hr tablet Take 50mg  in the morning and 25mg  in the evening 45 tablet 5  . nitroGLYCERIN (NITROSTAT) 0.4 MG SL tablet Place 1 tablet (0.4 mg total) under the tongue every 5 (five) minutes as needed for chest pain (up to 3 doses).    . simvastatin (ZOCOR) 40 MG tablet Take 40 mg by mouth at bedtime.       No current  facility-administered medications for this visit.    Past Medical History  Diagnosis Date  . Unspecified essential hypertension   . Other and unspecified hyperlipidemia   . CAD (coronary artery disease)     a. DES to [place] 06/2007. b. Cath 01/2013 due to abnormal nuc at [facility]: nonobst dz, patent stent, EF 55-65%.  . Morbid obesity   . Irritable bowel syndrome   . Unspecified asthma(493.90)   . Iron deficiency anemia, unspecified     Past Surgical History  Procedure Laterality Date  . Tonsillectomy      as a child  . Dilation and curettage of uterus    . Cholecystectomy open  1987  . Gastric bypass surgery  1993    Subtotal gastrectomy  . Carpal tunnel release      Bilateral  . Incisional hernia repair  01/25/2003    with mesh  . Left heart catheterization with coronary angiogram N/A 02/15/2013    Procedure: LEFT HEART CATHETERIZATION WITH CORONARY ANGIOGRAM;  Surgeon: Peter M Martinique, MD;  Location: [facility] CATH LAB;  Service: Cardiovascular;  Laterality: N/A;    History   Social History  . Marital Status: Divorced    Spouse Name: N/A  . Number of Children: 2  . Years of Education: N/A   Occupational History  .  RETIRED    Social History Main Topics  . Smoking status: Former Smoker -- 2.00 packs/day for 18 years    Types: Cigarettes    Quit date: 05/19/1965  . Smokeless tobacco: Former Systems developer    Types: Snuff, Sarina Ser    Quit date: 05/19/1946     Comment: Started smoking at age 8-has not smoked in 38 years/dipped snuff and chewed tobacco during childhood  . Alcohol Use: No  . Drug Use: No  . Sexual Activity: Not on file   Other Topics Concern  . Not on file   Social History Narrative     Filed Vitals:   08/14/14 1545  BP: 132/82  Pulse: 74  Height: 5\' 5"  (1.651 m)  Weight: 278 lb (126.1 kg)  SpO2: 92%    PHYSICAL EXAM General: NAD HEENT: Normal. Neck: No JVD, no thyromegaly. Lungs: Clear to auscultation bilaterally with normal respiratory effort. CV:  Nondisplaced PMI.  Regular rate and rhythm, normal S1/S2, no S3/S4, no murmur. No pretibial or periankle edema.   Abdomen: Soft, obese, no distention.  Neurologic: Alert and oriented x 3.  Psych: Normal affect. Skin: Normal. Musculoskeletal: No gross deformities. Left arm mildly tender to palpation. No ecchymoses. Extremities: No clubbing or cyanosis.   ECG: Most recent ECG reviewed.      ASSESSMENT AND PLAN: 1. CAD: Symptomatically stable. Cath on 02/15/13 demonstrated nonobstructive disease and a patent stent. Continue ASA, Toprol-XL, Imdur, and simvastatin.   2. Essential HTN: Controlled on present therapy. No changes.  3. Hyperlipidemia: Stable with mildly elevated TG in 12/2013. Continue simvastatin 40 mg.  Dispo: f/u 1 year.  Kate Sable, M.D., F.A.C.C.

## 2014-08-15 DIAGNOSIS — N183 Chronic kidney disease, stage 3 (moderate): Secondary | ICD-10-CM | POA: Diagnosis not present

## 2014-08-15 DIAGNOSIS — Z0001 Encounter for general adult medical examination with abnormal findings: Secondary | ICD-10-CM | POA: Diagnosis not present

## 2014-10-09 ENCOUNTER — Other Ambulatory Visit: Payer: Self-pay | Admitting: *Deleted

## 2014-10-09 MED ORDER — ISOSORBIDE MONONITRATE ER 60 MG PO TB24: 60.0000 mg | ORAL_TABLET | Freq: Every day | ORAL | Status: DC

## 2015-01-01 DIAGNOSIS — M1990 Unspecified osteoarthritis, unspecified site: Secondary | ICD-10-CM | POA: Diagnosis not present

## 2015-01-01 DIAGNOSIS — M79641 Pain in right hand: Secondary | ICD-10-CM | POA: Diagnosis not present

## 2015-01-03 DIAGNOSIS — E782 Mixed hyperlipidemia: Secondary | ICD-10-CM | POA: Diagnosis not present

## 2015-01-03 DIAGNOSIS — I10 Essential (primary) hypertension: Secondary | ICD-10-CM | POA: Diagnosis not present

## 2015-01-08 DIAGNOSIS — L570 Actinic keratosis: Secondary | ICD-10-CM | POA: Diagnosis not present

## 2015-01-08 DIAGNOSIS — Z85828 Personal history of other malignant neoplasm of skin: Secondary | ICD-10-CM | POA: Diagnosis not present

## 2015-01-08 DIAGNOSIS — M79641 Pain in right hand: Secondary | ICD-10-CM | POA: Diagnosis not present

## 2015-01-11 DIAGNOSIS — I2510 Atherosclerotic heart disease of native coronary artery without angina pectoris: Secondary | ICD-10-CM | POA: Diagnosis not present

## 2015-01-11 DIAGNOSIS — I10 Essential (primary) hypertension: Secondary | ICD-10-CM | POA: Diagnosis not present

## 2015-01-11 DIAGNOSIS — M1712 Unilateral primary osteoarthritis, left knee: Secondary | ICD-10-CM | POA: Diagnosis not present

## 2015-01-11 DIAGNOSIS — N183 Chronic kidney disease, stage 3 (moderate): Secondary | ICD-10-CM | POA: Diagnosis not present

## 2015-01-11 DIAGNOSIS — E782 Mixed hyperlipidemia: Secondary | ICD-10-CM | POA: Diagnosis not present

## 2015-05-23 DIAGNOSIS — E162 Hypoglycemia, unspecified: Secondary | ICD-10-CM | POA: Diagnosis not present

## 2015-05-23 DIAGNOSIS — I10 Essential (primary) hypertension: Secondary | ICD-10-CM | POA: Diagnosis not present

## 2015-05-23 DIAGNOSIS — N183 Chronic kidney disease, stage 3 (moderate): Secondary | ICD-10-CM | POA: Diagnosis not present

## 2015-05-23 DIAGNOSIS — E782 Mixed hyperlipidemia: Secondary | ICD-10-CM | POA: Diagnosis not present

## 2015-05-23 DIAGNOSIS — I2510 Atherosclerotic heart disease of native coronary artery without angina pectoris: Secondary | ICD-10-CM | POA: Diagnosis not present

## 2015-07-06 DIAGNOSIS — E782 Mixed hyperlipidemia: Secondary | ICD-10-CM | POA: Diagnosis not present

## 2015-07-06 DIAGNOSIS — N183 Chronic kidney disease, stage 3 (moderate): Secondary | ICD-10-CM | POA: Diagnosis not present

## 2015-07-06 DIAGNOSIS — I10 Essential (primary) hypertension: Secondary | ICD-10-CM | POA: Diagnosis not present

## 2015-07-09 DIAGNOSIS — E782 Mixed hyperlipidemia: Secondary | ICD-10-CM | POA: Diagnosis not present

## 2015-07-09 DIAGNOSIS — I2510 Atherosclerotic heart disease of native coronary artery without angina pectoris: Secondary | ICD-10-CM | POA: Diagnosis not present

## 2015-07-09 DIAGNOSIS — N183 Chronic kidney disease, stage 3 (moderate): Secondary | ICD-10-CM | POA: Diagnosis not present

## 2015-07-09 DIAGNOSIS — J4520 Mild intermittent asthma, uncomplicated: Secondary | ICD-10-CM | POA: Diagnosis not present

## 2015-07-09 DIAGNOSIS — I10 Essential (primary) hypertension: Secondary | ICD-10-CM | POA: Diagnosis not present

## 2015-07-16 DIAGNOSIS — R413 Other amnesia: Secondary | ICD-10-CM | POA: Diagnosis not present

## 2015-07-16 DIAGNOSIS — R42 Dizziness and giddiness: Secondary | ICD-10-CM | POA: Diagnosis not present

## 2015-07-16 DIAGNOSIS — I638 Other cerebral infarction: Secondary | ICD-10-CM | POA: Diagnosis not present

## 2015-07-18 DIAGNOSIS — M19049 Primary osteoarthritis, unspecified hand: Secondary | ICD-10-CM | POA: Diagnosis not present

## 2015-08-07 DIAGNOSIS — G4736 Sleep related hypoventilation in conditions classified elsewhere: Secondary | ICD-10-CM | POA: Diagnosis not present

## 2015-08-07 DIAGNOSIS — G4733 Obstructive sleep apnea (adult) (pediatric): Secondary | ICD-10-CM | POA: Diagnosis not present

## 2015-08-10 ENCOUNTER — Ambulatory Visit (INDEPENDENT_AMBULATORY_CARE_PROVIDER_SITE_OTHER): Payer: Medicare Other | Admitting: Cardiovascular Disease

## 2015-08-10 ENCOUNTER — Encounter: Payer: Self-pay | Admitting: Cardiovascular Disease

## 2015-08-10 VITALS — BP 122/76 | HR 69 | Ht 65.0 in | Wt 271.0 lb

## 2015-08-10 DIAGNOSIS — E785 Hyperlipidemia, unspecified: Secondary | ICD-10-CM

## 2015-08-10 DIAGNOSIS — R079 Chest pain, unspecified: Secondary | ICD-10-CM

## 2015-08-10 DIAGNOSIS — I25119 Atherosclerotic heart disease of native coronary artery with unspecified angina pectoris: Secondary | ICD-10-CM | POA: Diagnosis not present

## 2015-08-10 DIAGNOSIS — I10 Essential (primary) hypertension: Secondary | ICD-10-CM

## 2015-08-10 DIAGNOSIS — I69319 Unspecified symptoms and signs involving cognitive functions following cerebral infarction: Secondary | ICD-10-CM

## 2015-08-10 MED ORDER — CLOPIDOGREL BISULFATE 75 MG PO TABS: 75.0000 mg | ORAL_TABLET | Freq: Every day | ORAL | Status: DC

## 2015-08-10 MED ORDER — RANOLAZINE ER 500 MG PO TB12: 500.0000 mg | ORAL_TABLET | Freq: Two times a day (BID) | ORAL | Status: DC

## 2015-08-10 MED ORDER — RANOLAZINE ER 1000 MG PO TB12: 1000.0000 mg | ORAL_TABLET | Freq: Two times a day (BID) | ORAL | Status: DC

## 2015-08-10 NOTE — Progress Notes (Signed)
Patient ID: Tara Love, female   DOB: Apr 10, 1941, 75 y.o.   MRN: [id number]      SUBJECTIVE: The patient presents for routine follow-up. She has a history of chest pain, coronary artery disease with prior PCI, essential hypertension, hyperlipidemia , obesity, and asthma.  She describes a constant chest pain with brief exacerbations of more intense chest pain. She says, "I have learned to live with it". 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 She has had memory issues ever since. She tells me that an MRI perhaps showed an old CVA. She was recently diagnosed with "borderline sleep apnea".   Review of Systems: As per "subjective", otherwise negative.  Allergies  Allergen Reactions  . Codeine Phosphate     REACTION: nausea  . Morphine Sulfate     REACTION: nausea    Current Outpatient Prescriptions  Medication Sig Dispense Refill  . acetaminophen (TYLENOL) 500 MG tablet Take 500 mg by mouth every 6 (six) hours as needed for pain. For pain in back and knee    . aspirin EC 81 MG tablet Take 81 mg by mouth daily.      . furosemide (LASIX) 20 MG tablet Take 20 mg by mouth daily as needed for fluid.     . isosorbide mononitrate (IMDUR) 60 MG 24 hr tablet Take 1 tablet (60 mg total) by mouth daily. 30 tablet 6  . magnesium oxide (MAG-OX) 400 MG tablet Take 400 mg by mouth daily.    . metoprolol succinate (TOPROL-XL) 50 MG 24 hr tablet Take 1 tablet (50 mg total) by mouth every morning.    . nitroGLYCERIN (NITROSTAT) 0.4 MG SL tablet Place 1 tablet (0.4 mg total) under the tongue every 5 (five) minutes as needed for chest pain (up to 3 doses).    . simvastatin (ZOCOR) 40 MG tablet Take 40 mg by mouth at bedtime.       No current facility-administered medications for this visit.    Past Medical History  Diagnosis Date  . Unspecified essential hypertension   . Other and unspecified hyperlipidemia   . CAD (coronary artery disease)     a. DES to [place]  06/2007. b. Cath 01/2013 due to abnormal nuc at [facility]: nonobst dz, patent stent, EF 55-65%.  . Morbid obesity ([place])   . Irritable bowel syndrome   . Unspecified asthma(493.90)   . Iron deficiency anemia, unspecified     Past Surgical History  Procedure Laterality Date  . Tonsillectomy      as a child  . Dilation and curettage of uterus    . Cholecystectomy open  1987  . Gastric bypass surgery  1993    Subtotal gastrectomy  . Carpal tunnel release      Bilateral  . Incisional hernia repair  01/25/2003    with mesh  . Left heart catheterization with coronary angiogram N/A 02/15/2013    Procedure: LEFT HEART CATHETERIZATION WITH CORONARY ANGIOGRAM;  Surgeon: Peter M Martinique, MD;  Location: [facility] CATH LAB;  Service: Cardiovascular;  Laterality: N/A;    Social History   Social History  . Marital Status: Divorced    Spouse Name: N/A  . Number of Children: 2  . Years of Education: N/A   Occupational History  . RETIRED    Social History Main Topics  . Smoking status: Former Smoker -- 2.00 packs/day for 18 years    Types: Cigarettes    Start date: 05/20/1947    Quit  date: 05/19/1965  . Smokeless tobacco: Former Systems developer    Types: Snuff, Sarina Ser    Quit date: 05/19/1946     Comment: Started smoking at age 47-has not smoked in 2 years/dipped snuff and chewed tobacco during childhood  . Alcohol Use: No  . Drug Use: No  . Sexual Activity: Not on file   Other Topics Concern  . Not on file   Social History Narrative     Filed Vitals:   08/10/15 1411  BP: 122/76  Pulse: 69  Height: 5\' 5"  (1.651 m)  Weight: 271 lb (122.925 kg)    PHYSICAL EXAM General: NAD HEENT: Normal. Neck: No JVD, no thyromegaly. Lungs: Clear to auscultation bilaterally with normal respiratory effort. CV: Nondisplaced PMI.  Regular rate and rhythm, normal S1/S2, no S3/S4, no murmur. No pretibial or periankle edema.  No carotid bruit.   Abdomen: Soft, Soft, obese, no distention.  Neurologic: Alert and  oriented.  Psych: Normal affect. Skin: Normal. Musculoskeletal: No gross deformities.  ECG: Most recent ECG reviewed.      ASSESSMENT AND PLAN: 1. Chest pain/CAD: Symptomatically stable. Cath on 02/15/13 demonstrated nonobstructive disease and a patent stent. Will d/c Imdur and start Ranexa 500 mg BID x one week, then increase to 1000 mg BID. Given history of CVA and more recent TIA, will discontinue aspirin and start Plavix 75 mg daily. Continue Toprol-XL 50 mg daily and simvastatin 40 mg.  2. Essential HTN: Controlled on present therapy. No changes.  3. Hyperlipidemia: Obtain copy of lipids. Continue simvastatin 40 mg.  4. CVA: Will stop ASA and start Plavix.  Dispo: f/u 6 months.   Kate Sable, M.D., F.A.C.C.

## 2015-08-10 NOTE — Patient Instructions (Addendum)
   Stop Aspirin  Begin Plavix 75mg  daily - new sent to [facility] today.  Begin Ranexa 500mg  twice a day x 7 days, then increase to 1,000mg  twice a day - samples & printed script given today. Continue all other medications.   Your physician wants you to follow up in: 6 months.  You will receive a reminder letter in the mail one-two months in advance.  If you don't receive a letter, please call our office to schedule the follow up appointment

## 2015-08-21 ENCOUNTER — Telehealth: Payer: Self-pay | Admitting: Cardiovascular Disease

## 2015-08-21 NOTE — Telephone Encounter (Signed)
Patient had questions about her Ranexa.  Stated that she realized that she had only been doing the 500mg  daily instead of twice a day.  Also, stated that she had already gotten the 1,000mg  tabs from pharmacy.  Advised her to start today with the 500mg  tabs she has left twice a day.  After doing 7 days of that, can go up to the 1,000mg  twice a day and she will be straight.  Patient verbalized understanding.

## 2015-08-21 NOTE — Telephone Encounter (Signed)
Tara Love called stating that she needs to speak with someone about her medications.

## 2015-09-16 ENCOUNTER — Other Ambulatory Visit: Payer: Self-pay | Admitting: Cardiovascular Disease

## 2015-10-10 DIAGNOSIS — J4541 Moderate persistent asthma with (acute) exacerbation: Secondary | ICD-10-CM | POA: Diagnosis not present

## 2015-10-10 DIAGNOSIS — R0602 Shortness of breath: Secondary | ICD-10-CM | POA: Diagnosis not present

## 2015-10-10 DIAGNOSIS — J209 Acute bronchitis, unspecified: Secondary | ICD-10-CM | POA: Diagnosis not present

## 2015-10-10 DIAGNOSIS — R05 Cough: Secondary | ICD-10-CM | POA: Diagnosis not present

## 2015-12-13 DIAGNOSIS — R0902 Hypoxemia: Secondary | ICD-10-CM | POA: Diagnosis not present

## 2015-12-13 DIAGNOSIS — M47816 Spondylosis without myelopathy or radiculopathy, lumbar region: Secondary | ICD-10-CM | POA: Diagnosis not present

## 2015-12-13 DIAGNOSIS — J441 Chronic obstructive pulmonary disease with (acute) exacerbation: Secondary | ICD-10-CM | POA: Diagnosis not present

## 2015-12-13 DIAGNOSIS — S300XXA Contusion of lower back and pelvis, initial encounter: Secondary | ICD-10-CM | POA: Diagnosis not present

## 2015-12-13 DIAGNOSIS — W0110XA Fall on same level from slipping, tripping and stumbling with subsequent striking against unspecified object, initial encounter: Secondary | ICD-10-CM | POA: Diagnosis not present

## 2015-12-13 DIAGNOSIS — J989 Respiratory disorder, unspecified: Secondary | ICD-10-CM | POA: Diagnosis not present

## 2015-12-13 DIAGNOSIS — S3993XA Unspecified injury of pelvis, initial encounter: Secondary | ICD-10-CM | POA: Diagnosis not present

## 2015-12-13 DIAGNOSIS — M79602 Pain in left arm: Secondary | ICD-10-CM | POA: Diagnosis not present

## 2015-12-13 DIAGNOSIS — R079 Chest pain, unspecified: Secondary | ICD-10-CM | POA: Diagnosis not present

## 2015-12-13 DIAGNOSIS — R51 Headache: Secondary | ICD-10-CM | POA: Diagnosis not present

## 2015-12-13 DIAGNOSIS — R791 Abnormal coagulation profile: Secondary | ICD-10-CM | POA: Diagnosis not present

## 2015-12-14 DIAGNOSIS — B9620 Unspecified Escherichia coli [E. coli] as the cause of diseases classified elsewhere: Secondary | ICD-10-CM | POA: Diagnosis not present

## 2015-12-14 DIAGNOSIS — K5900 Constipation, unspecified: Secondary | ICD-10-CM | POA: Diagnosis not present

## 2015-12-14 DIAGNOSIS — Z7902 Long term (current) use of antithrombotics/antiplatelets: Secondary | ICD-10-CM | POA: Diagnosis not present

## 2015-12-14 DIAGNOSIS — Z79899 Other long term (current) drug therapy: Secondary | ICD-10-CM | POA: Diagnosis not present

## 2015-12-14 DIAGNOSIS — M170 Bilateral primary osteoarthritis of knee: Secondary | ICD-10-CM | POA: Diagnosis not present

## 2015-12-14 DIAGNOSIS — M79622 Pain in left upper arm: Secondary | ICD-10-CM | POA: Diagnosis not present

## 2015-12-14 DIAGNOSIS — R2689 Other abnormalities of gait and mobility: Secondary | ICD-10-CM | POA: Diagnosis not present

## 2015-12-14 DIAGNOSIS — J449 Chronic obstructive pulmonary disease, unspecified: Secondary | ICD-10-CM | POA: Diagnosis not present

## 2015-12-14 DIAGNOSIS — Z825 Family history of asthma and other chronic lower respiratory diseases: Secondary | ICD-10-CM | POA: Diagnosis not present

## 2015-12-14 DIAGNOSIS — M47816 Spondylosis without myelopathy or radiculopathy, lumbar region: Secondary | ICD-10-CM | POA: Diagnosis not present

## 2015-12-14 DIAGNOSIS — R791 Abnormal coagulation profile: Secondary | ICD-10-CM | POA: Diagnosis not present

## 2015-12-14 DIAGNOSIS — R51 Headache: Secondary | ICD-10-CM | POA: Diagnosis not present

## 2015-12-14 DIAGNOSIS — R0902 Hypoxemia: Secondary | ICD-10-CM | POA: Diagnosis not present

## 2015-12-14 DIAGNOSIS — J9602 Acute respiratory failure with hypercapnia: Secondary | ICD-10-CM | POA: Diagnosis not present

## 2015-12-14 DIAGNOSIS — G4733 Obstructive sleep apnea (adult) (pediatric): Secondary | ICD-10-CM | POA: Diagnosis not present

## 2015-12-14 DIAGNOSIS — W0110XA Fall on same level from slipping, tripping and stumbling with subsequent striking against unspecified object, initial encounter: Secondary | ICD-10-CM | POA: Diagnosis not present

## 2015-12-14 DIAGNOSIS — M1712 Unilateral primary osteoarthritis, left knee: Secondary | ICD-10-CM | POA: Diagnosis not present

## 2015-12-14 DIAGNOSIS — S3993XA Unspecified injury of pelvis, initial encounter: Secondary | ICD-10-CM | POA: Diagnosis not present

## 2015-12-14 DIAGNOSIS — M79602 Pain in left arm: Secondary | ICD-10-CM | POA: Diagnosis not present

## 2015-12-14 DIAGNOSIS — N390 Urinary tract infection, site not specified: Secondary | ICD-10-CM | POA: Diagnosis not present

## 2015-12-14 DIAGNOSIS — J9601 Acute respiratory failure with hypoxia: Secondary | ICD-10-CM | POA: Diagnosis not present

## 2015-12-14 DIAGNOSIS — I10 Essential (primary) hypertension: Secondary | ICD-10-CM | POA: Diagnosis not present

## 2015-12-14 DIAGNOSIS — R079 Chest pain, unspecified: Secondary | ICD-10-CM | POA: Diagnosis not present

## 2015-12-14 DIAGNOSIS — R0600 Dyspnea, unspecified: Secondary | ICD-10-CM | POA: Diagnosis not present

## 2015-12-14 DIAGNOSIS — J439 Emphysema, unspecified: Secondary | ICD-10-CM | POA: Diagnosis not present

## 2015-12-14 DIAGNOSIS — Z809 Family history of malignant neoplasm, unspecified: Secondary | ICD-10-CM | POA: Diagnosis not present

## 2015-12-14 DIAGNOSIS — E222 Syndrome of inappropriate secretion of antidiuretic hormone: Secondary | ICD-10-CM | POA: Diagnosis not present

## 2015-12-14 DIAGNOSIS — Z8249 Family history of ischemic heart disease and other diseases of the circulatory system: Secondary | ICD-10-CM | POA: Diagnosis not present

## 2015-12-14 DIAGNOSIS — Z87891 Personal history of nicotine dependence: Secondary | ICD-10-CM | POA: Diagnosis not present

## 2015-12-14 DIAGNOSIS — E7800 Pure hypercholesterolemia, unspecified: Secondary | ICD-10-CM | POA: Diagnosis not present

## 2015-12-14 DIAGNOSIS — J989 Respiratory disorder, unspecified: Secondary | ICD-10-CM | POA: Diagnosis not present

## 2015-12-14 DIAGNOSIS — Z9181 History of falling: Secondary | ICD-10-CM | POA: Diagnosis not present

## 2015-12-14 DIAGNOSIS — J441 Chronic obstructive pulmonary disease with (acute) exacerbation: Secondary | ICD-10-CM | POA: Diagnosis not present

## 2015-12-14 DIAGNOSIS — M25562 Pain in left knee: Secondary | ICD-10-CM | POA: Diagnosis not present

## 2015-12-14 DIAGNOSIS — G4730 Sleep apnea, unspecified: Secondary | ICD-10-CM | POA: Diagnosis not present

## 2015-12-14 DIAGNOSIS — J45901 Unspecified asthma with (acute) exacerbation: Secondary | ICD-10-CM | POA: Diagnosis not present

## 2015-12-14 DIAGNOSIS — S300XXA Contusion of lower back and pelvis, initial encounter: Secondary | ICD-10-CM | POA: Diagnosis not present

## 2015-12-14 DIAGNOSIS — I2510 Atherosclerotic heart disease of native coronary artery without angina pectoris: Secondary | ICD-10-CM | POA: Diagnosis not present

## 2015-12-14 DIAGNOSIS — E785 Hyperlipidemia, unspecified: Secondary | ICD-10-CM | POA: Diagnosis not present

## 2015-12-14 DIAGNOSIS — Z955 Presence of coronary angioplasty implant and graft: Secondary | ICD-10-CM | POA: Diagnosis not present

## 2015-12-14 DIAGNOSIS — M6281 Muscle weakness (generalized): Secondary | ICD-10-CM | POA: Diagnosis not present

## 2015-12-14 DIAGNOSIS — Z886 Allergy status to analgesic agent status: Secondary | ICD-10-CM | POA: Diagnosis not present

## 2015-12-19 DIAGNOSIS — R0902 Hypoxemia: Secondary | ICD-10-CM | POA: Diagnosis not present

## 2015-12-19 DIAGNOSIS — M25552 Pain in left hip: Secondary | ICD-10-CM | POA: Diagnosis not present

## 2015-12-19 DIAGNOSIS — R2689 Other abnormalities of gait and mobility: Secondary | ICD-10-CM | POA: Diagnosis not present

## 2015-12-19 DIAGNOSIS — M1612 Unilateral primary osteoarthritis, left hip: Secondary | ICD-10-CM | POA: Diagnosis not present

## 2015-12-19 DIAGNOSIS — J9601 Acute respiratory failure with hypoxia: Secondary | ICD-10-CM | POA: Diagnosis not present

## 2015-12-19 DIAGNOSIS — K5900 Constipation, unspecified: Secondary | ICD-10-CM | POA: Diagnosis not present

## 2015-12-19 DIAGNOSIS — R0600 Dyspnea, unspecified: Secondary | ICD-10-CM | POA: Diagnosis not present

## 2015-12-19 DIAGNOSIS — G4730 Sleep apnea, unspecified: Secondary | ICD-10-CM | POA: Diagnosis not present

## 2015-12-19 DIAGNOSIS — I2510 Atherosclerotic heart disease of native coronary artery without angina pectoris: Secondary | ICD-10-CM | POA: Diagnosis not present

## 2015-12-19 DIAGNOSIS — M1712 Unilateral primary osteoarthritis, left knee: Secondary | ICD-10-CM | POA: Diagnosis not present

## 2015-12-19 DIAGNOSIS — Z9181 History of falling: Secondary | ICD-10-CM | POA: Diagnosis not present

## 2015-12-19 DIAGNOSIS — M47816 Spondylosis without myelopathy or radiculopathy, lumbar region: Secondary | ICD-10-CM | POA: Diagnosis not present

## 2015-12-19 DIAGNOSIS — I10 Essential (primary) hypertension: Secondary | ICD-10-CM | POA: Diagnosis not present

## 2015-12-19 DIAGNOSIS — M79605 Pain in left leg: Secondary | ICD-10-CM | POA: Diagnosis not present

## 2015-12-19 DIAGNOSIS — M6281 Muscle weakness (generalized): Secondary | ICD-10-CM | POA: Diagnosis not present

## 2015-12-19 DIAGNOSIS — I700 Atherosclerosis of aorta: Secondary | ICD-10-CM | POA: Diagnosis not present

## 2015-12-19 DIAGNOSIS — M545 Low back pain: Secondary | ICD-10-CM | POA: Diagnosis not present

## 2015-12-19 DIAGNOSIS — J45901 Unspecified asthma with (acute) exacerbation: Secondary | ICD-10-CM | POA: Diagnosis not present

## 2015-12-19 DIAGNOSIS — R200 Anesthesia of skin: Secondary | ICD-10-CM | POA: Diagnosis not present

## 2015-12-19 DIAGNOSIS — E785 Hyperlipidemia, unspecified: Secondary | ICD-10-CM | POA: Diagnosis not present

## 2015-12-21 DIAGNOSIS — J9601 Acute respiratory failure with hypoxia: Secondary | ICD-10-CM | POA: Diagnosis not present

## 2015-12-27 DIAGNOSIS — M25552 Pain in left hip: Secondary | ICD-10-CM | POA: Diagnosis not present

## 2015-12-27 DIAGNOSIS — M47816 Spondylosis without myelopathy or radiculopathy, lumbar region: Secondary | ICD-10-CM | POA: Diagnosis not present

## 2015-12-27 DIAGNOSIS — M1612 Unilateral primary osteoarthritis, left hip: Secondary | ICD-10-CM | POA: Diagnosis not present

## 2015-12-27 DIAGNOSIS — R200 Anesthesia of skin: Secondary | ICD-10-CM | POA: Diagnosis not present

## 2015-12-27 DIAGNOSIS — M79605 Pain in left leg: Secondary | ICD-10-CM | POA: Diagnosis not present

## 2015-12-27 DIAGNOSIS — M545 Low back pain: Secondary | ICD-10-CM | POA: Diagnosis not present

## 2015-12-27 DIAGNOSIS — I700 Atherosclerosis of aorta: Secondary | ICD-10-CM | POA: Diagnosis not present

## 2016-01-09 DIAGNOSIS — I2510 Atherosclerotic heart disease of native coronary artery without angina pectoris: Secondary | ICD-10-CM | POA: Diagnosis not present

## 2016-01-09 DIAGNOSIS — M1712 Unilateral primary osteoarthritis, left knee: Secondary | ICD-10-CM | POA: Diagnosis not present

## 2016-01-09 DIAGNOSIS — G4733 Obstructive sleep apnea (adult) (pediatric): Secondary | ICD-10-CM | POA: Diagnosis not present

## 2016-01-09 DIAGNOSIS — Z7901 Long term (current) use of anticoagulants: Secondary | ICD-10-CM | POA: Diagnosis not present

## 2016-01-09 DIAGNOSIS — J449 Chronic obstructive pulmonary disease, unspecified: Secondary | ICD-10-CM | POA: Diagnosis not present

## 2016-01-09 DIAGNOSIS — E222 Syndrome of inappropriate secretion of antidiuretic hormone: Secondary | ICD-10-CM | POA: Diagnosis not present

## 2016-01-09 DIAGNOSIS — Z9981 Dependence on supplemental oxygen: Secondary | ICD-10-CM | POA: Diagnosis not present

## 2016-01-09 DIAGNOSIS — I10 Essential (primary) hypertension: Secondary | ICD-10-CM | POA: Diagnosis not present

## 2016-01-09 DIAGNOSIS — Z9181 History of falling: Secondary | ICD-10-CM | POA: Diagnosis not present

## 2016-01-10 DIAGNOSIS — G4733 Obstructive sleep apnea (adult) (pediatric): Secondary | ICD-10-CM | POA: Diagnosis not present

## 2016-01-10 DIAGNOSIS — J449 Chronic obstructive pulmonary disease, unspecified: Secondary | ICD-10-CM | POA: Diagnosis not present

## 2016-01-10 DIAGNOSIS — J441 Chronic obstructive pulmonary disease with (acute) exacerbation: Secondary | ICD-10-CM | POA: Diagnosis not present

## 2016-01-10 DIAGNOSIS — J439 Emphysema, unspecified: Secondary | ICD-10-CM | POA: Diagnosis not present

## 2016-01-14 DIAGNOSIS — J449 Chronic obstructive pulmonary disease, unspecified: Secondary | ICD-10-CM | POA: Diagnosis not present

## 2016-01-16 DIAGNOSIS — E222 Syndrome of inappropriate secretion of antidiuretic hormone: Secondary | ICD-10-CM | POA: Diagnosis not present

## 2016-01-16 DIAGNOSIS — J449 Chronic obstructive pulmonary disease, unspecified: Secondary | ICD-10-CM | POA: Diagnosis not present

## 2016-01-16 DIAGNOSIS — Z7901 Long term (current) use of anticoagulants: Secondary | ICD-10-CM | POA: Diagnosis not present

## 2016-01-16 DIAGNOSIS — Z9981 Dependence on supplemental oxygen: Secondary | ICD-10-CM | POA: Diagnosis not present

## 2016-01-16 DIAGNOSIS — M1712 Unilateral primary osteoarthritis, left knee: Secondary | ICD-10-CM | POA: Diagnosis not present

## 2016-01-16 DIAGNOSIS — I2510 Atherosclerotic heart disease of native coronary artery without angina pectoris: Secondary | ICD-10-CM | POA: Diagnosis not present

## 2016-01-16 DIAGNOSIS — I10 Essential (primary) hypertension: Secondary | ICD-10-CM | POA: Diagnosis not present

## 2016-01-16 DIAGNOSIS — G4733 Obstructive sleep apnea (adult) (pediatric): Secondary | ICD-10-CM | POA: Diagnosis not present

## 2016-01-16 DIAGNOSIS — Z9181 History of falling: Secondary | ICD-10-CM | POA: Diagnosis not present

## 2016-01-22 DIAGNOSIS — Z9981 Dependence on supplemental oxygen: Secondary | ICD-10-CM | POA: Diagnosis not present

## 2016-01-22 DIAGNOSIS — I10 Essential (primary) hypertension: Secondary | ICD-10-CM | POA: Diagnosis not present

## 2016-01-22 DIAGNOSIS — I2510 Atherosclerotic heart disease of native coronary artery without angina pectoris: Secondary | ICD-10-CM | POA: Diagnosis not present

## 2016-01-22 DIAGNOSIS — Z9181 History of falling: Secondary | ICD-10-CM | POA: Diagnosis not present

## 2016-01-22 DIAGNOSIS — J449 Chronic obstructive pulmonary disease, unspecified: Secondary | ICD-10-CM | POA: Diagnosis not present

## 2016-01-22 DIAGNOSIS — Z7901 Long term (current) use of anticoagulants: Secondary | ICD-10-CM | POA: Diagnosis not present

## 2016-01-22 DIAGNOSIS — M1712 Unilateral primary osteoarthritis, left knee: Secondary | ICD-10-CM | POA: Diagnosis not present

## 2016-01-22 DIAGNOSIS — E222 Syndrome of inappropriate secretion of antidiuretic hormone: Secondary | ICD-10-CM | POA: Diagnosis not present

## 2016-01-22 DIAGNOSIS — G4733 Obstructive sleep apnea (adult) (pediatric): Secondary | ICD-10-CM | POA: Diagnosis not present

## 2016-01-28 DIAGNOSIS — M1712 Unilateral primary osteoarthritis, left knee: Secondary | ICD-10-CM | POA: Diagnosis not present

## 2016-01-28 DIAGNOSIS — E222 Syndrome of inappropriate secretion of antidiuretic hormone: Secondary | ICD-10-CM | POA: Diagnosis not present

## 2016-01-28 DIAGNOSIS — I10 Essential (primary) hypertension: Secondary | ICD-10-CM | POA: Diagnosis not present

## 2016-01-28 DIAGNOSIS — J449 Chronic obstructive pulmonary disease, unspecified: Secondary | ICD-10-CM | POA: Diagnosis not present

## 2016-01-28 DIAGNOSIS — Z7901 Long term (current) use of anticoagulants: Secondary | ICD-10-CM | POA: Diagnosis not present

## 2016-01-28 DIAGNOSIS — I2510 Atherosclerotic heart disease of native coronary artery without angina pectoris: Secondary | ICD-10-CM | POA: Diagnosis not present

## 2016-01-28 DIAGNOSIS — Z9181 History of falling: Secondary | ICD-10-CM | POA: Diagnosis not present

## 2016-01-28 DIAGNOSIS — G4733 Obstructive sleep apnea (adult) (pediatric): Secondary | ICD-10-CM | POA: Diagnosis not present

## 2016-01-28 DIAGNOSIS — Z9981 Dependence on supplemental oxygen: Secondary | ICD-10-CM | POA: Diagnosis not present

## 2016-02-06 ENCOUNTER — Encounter: Payer: Self-pay | Admitting: *Deleted

## 2016-02-07 ENCOUNTER — Encounter: Payer: Self-pay | Admitting: *Deleted

## 2016-02-07 ENCOUNTER — Ambulatory Visit (INDEPENDENT_AMBULATORY_CARE_PROVIDER_SITE_OTHER): Payer: Medicare Other | Admitting: Cardiovascular Disease

## 2016-02-07 ENCOUNTER — Encounter: Payer: Self-pay | Admitting: Cardiovascular Disease

## 2016-02-07 VITALS — BP 122/72 | HR 69 | Ht 65.0 in | Wt 233.0 lb

## 2016-02-07 DIAGNOSIS — R0602 Shortness of breath: Secondary | ICD-10-CM | POA: Diagnosis not present

## 2016-02-07 DIAGNOSIS — Z9989 Dependence on other enabling machines and devices: Secondary | ICD-10-CM

## 2016-02-07 DIAGNOSIS — I25119 Atherosclerotic heart disease of native coronary artery with unspecified angina pectoris: Secondary | ICD-10-CM

## 2016-02-07 DIAGNOSIS — E785 Hyperlipidemia, unspecified: Secondary | ICD-10-CM | POA: Diagnosis not present

## 2016-02-07 DIAGNOSIS — E162 Hypoglycemia, unspecified: Secondary | ICD-10-CM | POA: Diagnosis not present

## 2016-02-07 DIAGNOSIS — N183 Chronic kidney disease, stage 3 (moderate): Secondary | ICD-10-CM | POA: Diagnosis not present

## 2016-02-07 DIAGNOSIS — R079 Chest pain, unspecified: Secondary | ICD-10-CM

## 2016-02-07 DIAGNOSIS — G4733 Obstructive sleep apnea (adult) (pediatric): Secondary | ICD-10-CM

## 2016-02-07 DIAGNOSIS — I10 Essential (primary) hypertension: Secondary | ICD-10-CM

## 2016-02-07 DIAGNOSIS — E782 Mixed hyperlipidemia: Secondary | ICD-10-CM | POA: Diagnosis not present

## 2016-02-07 DIAGNOSIS — J9602 Acute respiratory failure with hypercapnia: Secondary | ICD-10-CM | POA: Diagnosis not present

## 2016-02-07 DIAGNOSIS — G458 Other transient cerebral ischemic attacks and related syndromes: Secondary | ICD-10-CM

## 2016-02-07 NOTE — Patient Instructions (Signed)

## 2016-02-07 NOTE — Progress Notes (Signed)
SUBJECTIVE: The patient presents for routine follow-up. She has a history of chest pain, coronary artery disease with prior PCI, essential hypertension, hyperlipidemia , TIA, obesity, and asthma.  ECG performed in the office today which I personally reviewed demonstrates normal sinus rhythm with no ischemic ST segment or T-wave abnormalities, nor any arrhythmias.  She is weak but gradually regaining her strength. Denies chest pain and shortness of breath. Diagnosed with sleep apnea and now using CPAP.   Review of Systems: As per "subjective", otherwise negative.  Allergies  Allergen Reactions  . Codeine Phosphate     REACTION: nausea  . Morphine Sulfate     REACTION: nausea    Current Outpatient Prescriptions  Medication Sig Dispense Refill  . acetaminophen (TYLENOL) 500 MG tablet Take 500 mg by mouth every 6 (six) hours as needed for pain. For pain in back and knee    . clopidogrel (PLAVIX) 75 MG tablet Take 1 tablet (75 mg total) by mouth daily. 30 tablet 6  . furosemide (LASIX) 20 MG tablet Take 20 mg by mouth daily as needed for fluid.     . isosorbide mononitrate (IMDUR) 60 MG 24 hr tablet TAKE ONE TABLET BY MOUTH ONCE DAILY 30 tablet 6  . magnesium oxide (MAG-OX) 400 MG tablet Take 400 mg by mouth daily.    . metoprolol succinate (TOPROL-XL) 50 MG 24 hr tablet Take 1 tablet (50 mg total) by mouth every morning.    . nitroGLYCERIN (NITROSTAT) 0.4 MG SL tablet Place 1 tablet (0.4 mg total) under the tongue every 5 (five) minutes as needed for chest pain (up to 3 doses).    . ranolazine (RANEXA) 1000 MG SR tablet Take 1 tablet (1,000 mg total) by mouth 2 (two) times daily. 60 tablet 6  . simvastatin (ZOCOR) 40 MG tablet Take 40 mg by mouth at bedtime.       No current facility-administered medications for this visit.     Past Medical History:  Diagnosis Date  . CAD (coronary artery disease)    a. DES to [place] 06/2007. b. Cath 01/2013 due to abnormal nuc at [facility]:  nonobst dz, patent stent, EF 55-65%.  . Iron deficiency anemia, unspecified   . Irritable bowel syndrome   . Morbid obesity ([place])   . Other and unspecified hyperlipidemia   . Unspecified asthma(493.90)   . Unspecified essential hypertension     Past Surgical History:  Procedure Laterality Date  . CARPAL TUNNEL RELEASE     Bilateral  . CHOLECYSTECTOMY OPEN  1987  . DILATION AND CURETTAGE OF UTERUS    . GASTRIC BYPASS SURGERY  1993   Subtotal gastrectomy  . INCISIONAL HERNIA REPAIR  01/25/2003   with mesh  . LEFT HEART CATHETERIZATION WITH CORONARY ANGIOGRAM N/A 02/15/2013   Procedure: LEFT HEART CATHETERIZATION WITH CORONARY ANGIOGRAM;  Surgeon: Peter M Martinique, MD;  Location: [facility] CATH LAB;  Service: Cardiovascular;  Laterality: N/A;  . TONSILLECTOMY     as a child    Social History   Social History  . Marital status: Divorced    Spouse name: N/A  . Number of children: 2  . Years of education: N/A   Occupational History  . RETIRED    Social History Main Topics  . Smoking status: Former Smoker    Packs/day: 2.00    Years: 18.00    Types: Cigarettes    Start date: 05/20/1947    Quit date: 05/19/1965  . Smokeless tobacco: Former Systems developer  Types: Snuff, Chew    Quit date: 05/19/1946     Comment: Started smoking at age 78-has not smoked in 71 years/dipped snuff and chewed tobacco during childhood  . Alcohol use No  . Drug use: No  . Sexual activity: Not on file   Other Topics Concern  . Not on file   Social History Narrative  . No narrative on file     Vitals:   02/07/16 1418  BP: 122/72  Pulse: 69  SpO2: 95%  Weight: 233 lb (105.7 kg)  Height: 5\' 5"  (1.651 m)    PHYSICAL EXAM General: NAD HEENT: Normal. Neck: No JVD, no thyromegaly. Lungs: Clear to auscultation bilaterally with normal respiratory effort. CV: Nondisplaced PMI.  Regular rate and rhythm, normal S1/S2, no S3/S4, no murmur. No pretibial or periankle edema.  No carotid bruit.   Abdomen: Soft, Soft,  obese, no distention.  Neurologic: Alert and oriented.  Psych: Normal affect. Skin: Normal. Musculoskeletal: No gross deformities.    ECG: Most recent ECG reviewed.      ASSESSMENT AND PLAN: 1. Chest pain/CAD: Symptomatically stable. Cath on 02/15/13 demonstrated nonobstructive disease and a patent stent. Continue Ranexa 1000 mg BID. Continue Toprol-XL 50 mg daily and simvastatin 40 mg.  2. Essential HTN: Controlled on present therapy. No changes.  3. Hyperlipidemia: Continue simvastatin 40 mg.  4. CVA: Continue Plavix.  5. OSA: Encouraged to consistently use CPAP.  Dispo: f/u 6 months.  Kate Sable, M.D., F.A.C.C.

## 2016-02-10 DIAGNOSIS — G4733 Obstructive sleep apnea (adult) (pediatric): Secondary | ICD-10-CM | POA: Diagnosis not present

## 2016-02-11 DIAGNOSIS — Z1212 Encounter for screening for malignant neoplasm of rectum: Secondary | ICD-10-CM | POA: Diagnosis not present

## 2016-02-11 DIAGNOSIS — J4520 Mild intermittent asthma, uncomplicated: Secondary | ICD-10-CM | POA: Diagnosis not present

## 2016-02-11 DIAGNOSIS — Z0001 Encounter for general adult medical examination with abnormal findings: Secondary | ICD-10-CM | POA: Diagnosis not present

## 2016-02-11 DIAGNOSIS — I10 Essential (primary) hypertension: Secondary | ICD-10-CM | POA: Diagnosis not present

## 2016-02-11 DIAGNOSIS — Z23 Encounter for immunization: Secondary | ICD-10-CM | POA: Diagnosis not present

## 2016-02-11 DIAGNOSIS — E782 Mixed hyperlipidemia: Secondary | ICD-10-CM | POA: Diagnosis not present

## 2016-02-11 DIAGNOSIS — I2510 Atherosclerotic heart disease of native coronary artery without angina pectoris: Secondary | ICD-10-CM | POA: Diagnosis not present

## 2016-02-19 DIAGNOSIS — M5410 Radiculopathy, site unspecified: Secondary | ICD-10-CM | POA: Diagnosis not present

## 2016-02-19 DIAGNOSIS — M545 Low back pain: Secondary | ICD-10-CM | POA: Diagnosis not present

## 2016-03-11 DIAGNOSIS — G4733 Obstructive sleep apnea (adult) (pediatric): Secondary | ICD-10-CM | POA: Diagnosis not present

## 2016-04-11 DIAGNOSIS — G4733 Obstructive sleep apnea (adult) (pediatric): Secondary | ICD-10-CM | POA: Diagnosis not present

## 2016-04-18 DIAGNOSIS — H2513 Age-related nuclear cataract, bilateral: Secondary | ICD-10-CM | POA: Diagnosis not present

## 2016-04-22 DIAGNOSIS — G4733 Obstructive sleep apnea (adult) (pediatric): Secondary | ICD-10-CM | POA: Diagnosis not present

## 2016-04-22 DIAGNOSIS — J441 Chronic obstructive pulmonary disease with (acute) exacerbation: Secondary | ICD-10-CM | POA: Diagnosis not present

## 2016-04-22 DIAGNOSIS — J439 Emphysema, unspecified: Secondary | ICD-10-CM | POA: Diagnosis not present

## 2016-04-22 DIAGNOSIS — J449 Chronic obstructive pulmonary disease, unspecified: Secondary | ICD-10-CM | POA: Diagnosis not present

## 2016-05-06 DIAGNOSIS — M79641 Pain in right hand: Secondary | ICD-10-CM | POA: Diagnosis not present

## 2016-05-06 DIAGNOSIS — M129 Arthropathy, unspecified: Secondary | ICD-10-CM | POA: Diagnosis not present

## 2016-05-11 DIAGNOSIS — G4733 Obstructive sleep apnea (adult) (pediatric): Secondary | ICD-10-CM | POA: Diagnosis not present

## 2016-06-10 DIAGNOSIS — I10 Essential (primary) hypertension: Secondary | ICD-10-CM | POA: Diagnosis not present

## 2016-06-10 DIAGNOSIS — E162 Hypoglycemia, unspecified: Secondary | ICD-10-CM | POA: Diagnosis not present

## 2016-06-10 DIAGNOSIS — E782 Mixed hyperlipidemia: Secondary | ICD-10-CM | POA: Diagnosis not present

## 2016-06-10 DIAGNOSIS — N183 Chronic kidney disease, stage 3 (moderate): Secondary | ICD-10-CM | POA: Diagnosis not present

## 2016-06-10 DIAGNOSIS — I2510 Atherosclerotic heart disease of native coronary artery without angina pectoris: Secondary | ICD-10-CM | POA: Diagnosis not present

## 2016-06-11 DIAGNOSIS — G4733 Obstructive sleep apnea (adult) (pediatric): Secondary | ICD-10-CM | POA: Diagnosis not present

## 2016-06-13 DIAGNOSIS — E782 Mixed hyperlipidemia: Secondary | ICD-10-CM | POA: Diagnosis not present

## 2016-06-13 DIAGNOSIS — N183 Chronic kidney disease, stage 3 (moderate): Secondary | ICD-10-CM | POA: Diagnosis not present

## 2016-06-13 DIAGNOSIS — I2510 Atherosclerotic heart disease of native coronary artery without angina pectoris: Secondary | ICD-10-CM | POA: Diagnosis not present

## 2016-06-13 DIAGNOSIS — Z23 Encounter for immunization: Secondary | ICD-10-CM | POA: Diagnosis not present

## 2016-06-13 DIAGNOSIS — I10 Essential (primary) hypertension: Secondary | ICD-10-CM | POA: Diagnosis not present

## 2016-06-13 DIAGNOSIS — J4520 Mild intermittent asthma, uncomplicated: Secondary | ICD-10-CM | POA: Diagnosis not present

## 2016-06-24 DIAGNOSIS — M545 Low back pain: Secondary | ICD-10-CM | POA: Diagnosis not present

## 2016-06-24 DIAGNOSIS — M5416 Radiculopathy, lumbar region: Secondary | ICD-10-CM | POA: Diagnosis not present

## 2016-06-26 DIAGNOSIS — M5416 Radiculopathy, lumbar region: Secondary | ICD-10-CM | POA: Diagnosis not present

## 2016-06-26 DIAGNOSIS — M545 Low back pain: Secondary | ICD-10-CM | POA: Diagnosis not present

## 2016-07-01 DIAGNOSIS — M5416 Radiculopathy, lumbar region: Secondary | ICD-10-CM | POA: Diagnosis not present

## 2016-07-01 DIAGNOSIS — M545 Low back pain: Secondary | ICD-10-CM | POA: Diagnosis not present

## 2016-07-03 DIAGNOSIS — M545 Low back pain: Secondary | ICD-10-CM | POA: Diagnosis not present

## 2016-07-03 DIAGNOSIS — M5416 Radiculopathy, lumbar region: Secondary | ICD-10-CM | POA: Diagnosis not present

## 2016-07-07 DIAGNOSIS — H2513 Age-related nuclear cataract, bilateral: Secondary | ICD-10-CM | POA: Diagnosis not present

## 2016-07-07 DIAGNOSIS — H2512 Age-related nuclear cataract, left eye: Secondary | ICD-10-CM | POA: Diagnosis not present

## 2016-07-08 NOTE — Patient Instructions (Signed)
Your procedure is scheduled on:07/14/2016                Report to [facility] at 7:00    AM.  Call this number if you have problems the morning of surgery: [phone]   Remember:   Do not eat or drink :After Midnight.    Take these medicines the morning of surgery with A SIP OF WATER:  Imdur, Metoprolol, and  Ranolazine (Renexa)         Do not wear jewelry, make-up or nail polish.  Do not wear lotions, powders, or perfumes. You may wear deodorant.  Do not bring valuables to the hospital.  Contacts, dentures or bridgework may not be worn into surgery.  Patients discharged the day of surgery will not be allowed to drive home.  Name and phone number of your driver:    @[email or web address] @ Cataract Surgery  A cataract is a clouding of the lens of the eye. When a lens becomes cloudy, vision is reduced based on the degree and nature of the clouding. Surgery may be needed to improve vision. Surgery removes the cloudy lens and usually replaces it with a substitute lens (intraocular lens, IOL). LET YOUR EYE DOCTOR KNOW ABOUT:  Allergies to food or medicine.   Medicines taken including herbs, eyedrops, over-the-counter medicines, and creams.   Use of steroids (by mouth or creams).   Previous problems with anesthetics or numbing medicine.   History of bleeding problems or blood clots.   Previous surgery.   Other health problems, including diabetes and kidney problems.   Possibility of pregnancy, if this applies.  RISKS AND COMPLICATIONS  Infection.   Inflammation of the eyeball (endophthalmitis) that can spread to both eyes (sympathetic ophthalmia).   Poor wound healing.   If an IOL is inserted, it can later fall out of proper position. This is very uncommon.   Clouding of the part of your eye that holds an IOL in place. This is called an "after-cataract." These are uncommon, but easily treated.  BEFORE THE PROCEDURE  Do not eat or drink anything except small amounts of water  for 8 to 12 before your surgery, or as directed by your caregiver.   Unless you are told otherwise, continue any eyedrops you have been prescribed.   Talk to your primary caregiver about all other medicines that you take (both prescription and non-prescription). In some cases, you may need to stop or change medicines near the time of your surgery. This is most important if you are taking blood-thinning medicine.Do not stop medicines unless you are told to do so.   Arrange for someone to drive you to and from the procedure.   Do not put contact lenses in either eye on the day of your surgery.  PROCEDURE There is more than one method for safely removing a cataract. Your doctor can explain the differences and help determine which is best for you. Phacoemulsification surgery is the most common form of cataract surgery.  An injection is given behind the eye or eyedrops are given to make this a painless procedure.   A small cut (incision) is made on the edge of the clear, dome-shaped surface that covers the front of the eye (cornea).   A tiny probe is painlessly inserted into the eye. This device gives off ultrasound waves that soften and break up the cloudy center of the lens. This makes it easier for the cloudy lens to be removed by suction.   An IOL  may be implanted.   The normal lens of the eye is covered by a clear capsule. Part of that capsule is intentionally left in the eye to support the IOL.   Your surgeon may or may not use stitches to close the incision.  There are other forms of cataract surgery that require a larger incision and stiches to close the eye. This approach is taken in cases where the doctor feels that the cataract cannot be easily removed using phacoemulsification. AFTER THE PROCEDURE  When an IOL is implanted, it does not need care. It becomes a permanent part of your eye and cannot be seen or felt.   Your doctor will schedule follow-up exams to check on your  progress.   Review your other medicines with your doctor to see which can be resumed after surgery.   Use eyedrops or take medicine as prescribed by your doctor.  Document Released: 04/24/2011 Document Reviewed: 04/21/2011 [facility] Patient Information 2012 [place].  .Cataract Surgery Care After Refer to this sheet in the next few weeks. These instructions provide you with information on caring for yourself after your procedure. Your caregiver may also give you more specific instructions. Your treatment has been planned according to current medical practices, but problems sometimes occur. Call your caregiver if you have any problems or questions after your procedure.  HOME CARE INSTRUCTIONS   Avoid strenuous activities as directed by your caregiver.   Ask your caregiver when you can resume driving.   Use eyedrops or other medicines to help healing and control pressure inside your eye as directed by your caregiver.   Only take over-the-counter or prescription medicines for pain, discomfort, or fever as directed by your caregiver.   Do not to touch or rub your eyes.   You may be instructed to use a protective shield during the first few days and nights after surgery. If not, wear sunglasses to protect your eyes. This is to protect the eye from pressure or from being accidentally bumped.   Keep the area around your eye clean and dry. Avoid swimming or allowing water to hit you directly in the face while showering. Keep soap and shampoo out of your eyes.   Do not bend or lift heavy objects. Bending increases pressure in the eye. You can walk, climb stairs, and do light household chores.   Do not put a contact lens into the eye that had surgery until your caregiver says it is okay to do so.   Ask your doctor when you can return to work. This will depend on the kind of work that you do. If you work in a dusty environment, you may be advised to wear protective eyewear for a period of  time.   Ask your caregiver when it will be safe to engage in sexual activity.   Continue with your regular eye exams as directed by your caregiver.  What to expect:  It is normal to feel itching and mild discomfort for a few days after cataract surgery. Some fluid discharge is also common, and your eye may be sensitive to light and touch.   After 1 to 2 days, even moderate discomfort should disappear. In most cases, healing will take about 6 weeks.   If you received an intraocular lens (IOL), you may notice that colors are very bright or have a blue tinge. Also, if you have been in bright sunlight, everything may appear reddish for a few hours. If you see these color tinges, it  is because your lens is clear and no longer cloudy. Within a few months after receiving an IOL, these extra colors should go away. When you have healed, you will probably need new glasses.  SEEK MEDICAL CARE IF:   You have increased bruising around your eye.   You have discomfort not helped by medicine.  SEEK IMMEDIATE MEDICAL CARE IF:   You have a fever.   You have a worsening or sudden vision loss.   You have redness, swelling, or increasing pain in the eye.   You have a thick discharge from the eye that had surgery.  MAKE SURE YOU:  Understand these instructions.   Will watch your condition.   Will get help right away if you are not doing well or get worse.  Document Released: 11/22/2004 Document Revised: 04/24/2011 Document Reviewed: 12/27/2010 [facility] Patient Information 2012 [place].    Monitored Anesthesia Care  Monitored anesthesia care is an anesthesia service for a medical procedure. Anesthesia is the loss of the ability to feel pain. It is produced by medications called anesthetics. It may affect a small area of your body (local anesthesia), a large area of your body (regional anesthesia), or your entire body (general anesthesia). The need for monitored anesthesia care depends your  procedure, your condition, and the potential need for regional or general anesthesia. It is often provided during procedures where:   General anesthesia may be needed if there are complications. This is because you need special care when you are under general anesthesia.   You will be under local or regional anesthesia. This is so that you are able to have higher levels of anesthesia if needed.   You will receive calming medications (sedatives). This is especially the case if sedatives are given to put you in a semi-conscious state of relaxation (deep sedation). This is because the amount of sedative needed to produce this state can be hard to predict. Too much of a sedative can produce general anesthesia. Monitored anesthesia care is performed by one or more caregivers who have special training in all types of anesthesia. You will need to meet with these caregivers before your procedure. During this meeting, they will ask you about your medical history. They will also give you instructions to follow. (For example, you will need to stop eating and drinking before your procedure. You may also need to stop or change medications you are taking.) During your procedure, your caregivers will stay with you. They will:   Watch your condition. This includes watching you blood pressure, breathing, and level of pain.   Diagnose and treat problems that occur.   Give medications if they are needed. These may include calming medications (sedatives) and anesthetics.   Make sure you are comfortable.  Having monitored anesthesia care does not necessarily mean that you will be under anesthesia. It does mean that your caregivers will be able to manage anesthesia if you need it or if it occurs. It also means that you will be able to have a different type of anesthesia than you are having if you need it. When your procedure is complete, your caregivers will continue to watch your condition. They will make sure any  medications wear off before you are allowed to go home.  Document Released: 01/29/2005 Document Revised: 08/30/2012 Document Reviewed: 06/16/2012 [facility] Patient Information 2014 [place], [place].

## 2016-07-09 ENCOUNTER — Encounter (HOSPITAL_COMMUNITY)
Admission: RE | Admit: 2016-07-09 | Discharge: 2016-07-09 | Disposition: A | Discharge status: Home / Self Care | Payer: Medicare Other | Source: Ambulatory Visit | Attending: Ophthalmology | Admitting: Ophthalmology

## 2016-07-09 ENCOUNTER — Encounter (HOSPITAL_COMMUNITY): Payer: Self-pay | Admitting: *Deleted

## 2016-07-09 DIAGNOSIS — Z01812 Encounter for preprocedural laboratory examination: Secondary | ICD-10-CM | POA: Insufficient documentation

## 2016-07-09 LAB — CBC
HEMATOCRIT: 41.4 % (ref 36.0–46.0)
HEMOGLOBIN: 12.8 g/dL (ref 12.0–15.0)
MCH: 29.5 pg (ref 26.0–34.0)
MCHC: 30.9 g/dL (ref 30.0–36.0)
MCV: 95.4 fL (ref 78.0–100.0)
Platelets: 260 10*3/uL (ref 150–400)
RBC: 4.34 MIL/uL (ref 3.87–5.11)
RDW: 15.2 % (ref 11.5–15.5)
WBC: 5.3 10*3/uL (ref 4.0–10.5)

## 2016-07-09 LAB — BASIC METABOLIC PANEL
Anion gap: 5 (ref 5–15)
BUN: 10 mg/dL (ref 6–20)
CALCIUM: 8.6 mg/dL — AB (ref 8.9–10.3)
CHLORIDE: 103 mmol/L (ref 101–111)
CO2: 30 mmol/L (ref 22–32)
CREATININE: 0.96 mg/dL (ref 0.44–1.00)
GFR calc Af Amer: >60 mL/min (ref >60)
GFR calc non Af Amer: 56 mL/min — ABNORMAL LOW (ref >60)
Glucose, Bld: 94 mg/dL (ref 65–99)
Potassium: 4.6 mmol/L (ref 3.5–5.1)
SODIUM: 138 mmol/L (ref 135–145)

## 2016-07-10 DIAGNOSIS — M5416 Radiculopathy, lumbar region: Secondary | ICD-10-CM | POA: Diagnosis not present

## 2016-07-10 DIAGNOSIS — M545 Low back pain: Secondary | ICD-10-CM | POA: Diagnosis not present

## 2016-07-12 DIAGNOSIS — G4733 Obstructive sleep apnea (adult) (pediatric): Secondary | ICD-10-CM | POA: Diagnosis not present

## 2016-07-14 ENCOUNTER — Ambulatory Visit (HOSPITAL_COMMUNITY): Payer: Medicare Other | Admitting: Anesthesiology

## 2016-07-14 ENCOUNTER — Ambulatory Visit (HOSPITAL_COMMUNITY)
Admission: RE | Admit: 2016-07-14 | Discharge: 2016-07-14 | Disposition: A | Discharge status: Home / Self Care | Payer: Medicare Other | Source: Ambulatory Visit | Attending: Ophthalmology | Admitting: Ophthalmology

## 2016-07-14 ENCOUNTER — Encounter (HOSPITAL_COMMUNITY): Payer: Self-pay

## 2016-07-14 ENCOUNTER — Encounter (HOSPITAL_COMMUNITY)
Admission: RE | Disposition: A | Discharge status: Home / Self Care | Payer: Self-pay | Source: Ambulatory Visit | Attending: Ophthalmology

## 2016-07-14 DIAGNOSIS — Z79899 Other long term (current) drug therapy: Secondary | ICD-10-CM | POA: Diagnosis not present

## 2016-07-14 DIAGNOSIS — Z87891 Personal history of nicotine dependence: Secondary | ICD-10-CM | POA: Diagnosis not present

## 2016-07-14 DIAGNOSIS — I739 Peripheral vascular disease, unspecified: Secondary | ICD-10-CM | POA: Diagnosis not present

## 2016-07-14 DIAGNOSIS — I2510 Atherosclerotic heart disease of native coronary artery without angina pectoris: Secondary | ICD-10-CM | POA: Diagnosis not present

## 2016-07-14 DIAGNOSIS — Z955 Presence of coronary angioplasty implant and graft: Secondary | ICD-10-CM | POA: Insufficient documentation

## 2016-07-14 DIAGNOSIS — I10 Essential (primary) hypertension: Secondary | ICD-10-CM | POA: Insufficient documentation

## 2016-07-14 DIAGNOSIS — H2512 Age-related nuclear cataract, left eye: Secondary | ICD-10-CM | POA: Insufficient documentation

## 2016-07-14 DIAGNOSIS — H2511 Age-related nuclear cataract, right eye: Secondary | ICD-10-CM | POA: Diagnosis not present

## 2016-07-14 HISTORY — PX: CATARACT EXTRACTION W/PHACO: SHX586

## 2016-07-14 SURGERY — PHACOEMULSIFICATION, CATARACT, WITH IOL INSERTION
Anesthesia: Monitor Anesthesia Care | Site: Eye | Laterality: Left

## 2016-07-14 MED ORDER — MIDAZOLAM HCL 2 MG/2ML IJ SOLN
1.0000 mg | INTRAMUSCULAR | Status: AC
  Administered 2016-07-14: 2 mg via INTRAVENOUS

## 2016-07-14 MED ORDER — LIDOCAINE HCL (PF) 1 % IJ SOLN
INTRAMUSCULAR | Status: DC | PRN
  Administered 2016-07-14: .7 mL

## 2016-07-14 MED ORDER — CYCLOPENTOLATE-PHENYLEPHRINE 0.2-1 % OP SOLN
1.0000 [drp] | OPHTHALMIC | Status: AC
  Administered 2016-07-14 (×3): 1 [drp] via OPHTHALMIC

## 2016-07-14 MED ORDER — POVIDONE-IODINE 5 % OP SOLN
OPHTHALMIC | Status: DC | PRN
  Administered 2016-07-14: 1 via OPHTHALMIC

## 2016-07-14 MED ORDER — FENTANYL CITRATE (PF) 100 MCG/2ML IJ SOLN
25.0000 ug | Freq: Once | INTRAMUSCULAR | Status: AC
  Administered 2016-07-14: 25 ug via INTRAVENOUS

## 2016-07-14 MED ORDER — MIDAZOLAM HCL 2 MG/2ML IJ SOLN
INTRAMUSCULAR | Status: AC
  Filled 2016-07-14: qty 2

## 2016-07-14 MED ORDER — LIDOCAINE 3.5 % OP GEL OPTIME - NO CHARGE
OPHTHALMIC | Status: DC | PRN
  Administered 2016-07-14: 1 [drp] via OPHTHALMIC

## 2016-07-14 MED ORDER — FENTANYL CITRATE (PF) 100 MCG/2ML IJ SOLN
INTRAMUSCULAR | Status: AC
  Filled 2016-07-14: qty 2

## 2016-07-14 MED ORDER — NEOMYCIN-POLYMYXIN-DEXAMETH 3.5-10000-0.1 OP SUSP
OPHTHALMIC | Status: DC | PRN
  Administered 2016-07-14: 2 [drp] via OPHTHALMIC

## 2016-07-14 MED ORDER — LIDOCAINE HCL 3.5 % OP GEL
1.0000 "application " | Freq: Once | OPHTHALMIC | Status: AC
  Administered 2016-07-14: 1 via OPHTHALMIC

## 2016-07-14 MED ORDER — BSS IO SOLN
INTRAOCULAR | Status: DC | PRN
  Administered 2016-07-14: 15 mL

## 2016-07-14 MED ORDER — EPINEPHRINE PF 1 MG/ML IJ SOLN
INTRAOCULAR | Status: DC | PRN
  Administered 2016-07-14: 500 mL

## 2016-07-14 MED ORDER — LACTATED RINGERS IV SOLN
INTRAVENOUS | Status: DC
  Administered 2016-07-14: 09:00:00 via INTRAVENOUS

## 2016-07-14 MED ORDER — TETRACAINE HCL 0.5 % OP SOLN
1.0000 [drp] | OPHTHALMIC | Status: AC
  Administered 2016-07-14 (×3): 1 [drp] via OPHTHALMIC

## 2016-07-14 MED ORDER — PHENYLEPHRINE HCL 2.5 % OP SOLN
1.0000 [drp] | OPHTHALMIC | Status: AC
  Administered 2016-07-14 (×3): 1 [drp] via OPHTHALMIC

## 2016-07-14 MED ORDER — PROVISC 10 MG/ML IO SOLN
INTRAOCULAR | Status: DC | PRN
  Administered 2016-07-14: 0.85 mL via INTRAOCULAR

## 2016-07-14 SURGICAL SUPPLY — 11 items
CLOTH BEACON ORANGE TIMEOUT ST (SAFETY) ×2 IMPLANT
EYE SHIELD UNIVERSAL CLEAR (GAUZE/BANDAGES/DRESSINGS) ×2 IMPLANT
GLOVE BIOGEL PI IND STRL 6.5 (GLOVE) ×1 IMPLANT
GLOVE BIOGEL PI INDICATOR 6.5 (GLOVE) ×1
GLOVE EXAM NITRILE MD LF STRL (GLOVE) ×2 IMPLANT
PAD ARMBOARD 7.5X6 YLW CONV (MISCELLANEOUS) ×2 IMPLANT
SIGHTPATH CAT PROC W REG LENS (Ophthalmic Related) ×2 IMPLANT
SYRINGE LUER LOK 1CC (MISCELLANEOUS) ×2 IMPLANT
TAPE SURG TRANSPORE 1 IN (GAUZE/BANDAGES/DRESSINGS) ×1 IMPLANT
TAPE SURGICAL TRANSPORE 1 IN (GAUZE/BANDAGES/DRESSINGS) ×1
WATER STERILE IRR 250ML POUR (IV SOLUTION) ×2 IMPLANT

## 2016-07-14 NOTE — Anesthesia Procedure Notes (Signed)
Procedure Name: MAC Date/Time: 07/14/2016 8:49 AM Performed by: Vista Deck Pre-anesthesia Checklist: Patient identified, Emergency Drugs available, Suction available, Timeout performed and Patient being monitored Patient Re-evaluated:Patient Re-evaluated prior to inductionOxygen Delivery Method: Nasal Cannula

## 2016-07-14 NOTE — Op Note (Signed)
Date of Admission: 07/14/2016  Date of Surgery: 07/14/2016  Pre-Op Dx: Cataract Left  Eye  Post-Op Dx: Senile Nuclear Cataract  Left  Eye,  Dx Code H25.12  Surgeon: Tonny Branch, M.D.  Assistants: None  Anesthesia: Topical with MAC  Indications: Painless, progressive loss of vision with compromise of daily activities.  Surgery: Cataract Extraction with Intraocular lens Implant Left Eye  Discription: The patient had dilating drops and viscous lidocaine placed into the Left eye in the pre-op holding area. After transfer to the operating room, a time out was performed. The patient was then prepped and draped. Beginning with a 11 degree blade a paracentesis port was made at the surgeon's 2 o'clock position. The anterior chamber was then filled with 1% non-preserved lidocaine. This was followed by filling the anterior chamber with Provisc.  A 2.67m keratome blade was used to make a clear corneal incision at the temporal limbus.  A bent cystatome needle was used to create a continuous tear capsulotomy. Hydrodissection was performed with balanced salt solution on a Fine canula. The lens nucleus was then removed using the phacoemulsification handpiece. Residual cortex was removed with the I&A handpiece. The anterior chamber and capsular bag were refilled with Provisc. A posterior chamber intraocular lens was placed into the capsular bag with it's injector. The implant was positioned with the Kuglan hook. The Provisc was then removed from the anterior chamber and capsular bag with the I&A handpiece. Stromal hydration of the main incision and paracentesis port was performed with BSS on a Fine canula. The wounds were tested for leak which was negative. The patient tolerated the procedure well. There were no operative complications. The patient was then transferred to the recovery room in stable condition.  Complications: None  Specimen: None  EBL: None  Prosthetic device: Abbott Technis, PCB00, power  14.0, SN [id number]

## 2016-07-14 NOTE — Anesthesia Postprocedure Evaluation (Signed)
Anesthesia Post Note  Patient: HAZELYN KALLEN  Procedure(s) Performed: Procedure(s) (LRB): CATARACT EXTRACTION PHACO AND INTRAOCULAR LENS PLACEMENT (IOC) (Left)  Patient location during evaluation: Short Stay Anesthesia Type: MAC Level of consciousness: awake and alert Pain management: pain level controlled Vital Signs Assessment: post-procedure vital signs reviewed and stable Respiratory status: spontaneous breathing Anesthetic complications: no     Last Vitals:  Vitals:   07/14/16 0842 07/14/16 0845  BP: (!) 114/45 (!) 102/46  Pulse: 67 67  Resp: 18 20  Temp:      Last Pain:  Vitals:   07/14/16 0812  TempSrc: Oral                 Keonia Pasko

## 2016-07-14 NOTE — Anesthesia Preprocedure Evaluation (Addendum)
Anesthesia Evaluation  Patient identified by MRN, date of birth, ID band Patient awake    Reviewed: Allergy & Precautions, NPO status , Patient's Chart, lab work & pertinent test results  Airway Mallampati: I  TM Distance: >3 FB     Dental  (+) Edentulous Upper, Partial Lower   Pulmonary former smoker (chronic cough),    breath sounds clear to auscultation       Cardiovascular hypertension, Pt. on medications and Pt. on home beta blockers + CAD, + Cardiac Stents and + Peripheral Vascular Disease   Rhythm:Regular Rate:Normal     Neuro/Psych    GI/Hepatic   Endo/Other    Renal/GU      Musculoskeletal   Abdominal   Peds  Hematology   Anesthesia Other Findings   Reproductive/Obstetrics                            Anesthesia Physical Anesthesia Plan  ASA: III  Anesthesia Plan: MAC   Post-op Pain Management:    Induction: Intravenous  Airway Management Planned:   Additional Equipment:   Intra-op Plan:   Post-operative Plan:   Informed Consent: I have reviewed the patients History and Physical, chart, labs and discussed the procedure including the risks, benefits and alternatives for the proposed anesthesia with the patient or authorized representative who has indicated his/her understanding and acceptance.     Plan Discussed with:   Anesthesia Plan Comments:         Anesthesia Quick Evaluation

## 2016-07-14 NOTE — Discharge Instructions (Signed)

## 2016-07-14 NOTE — Transfer of Care (Signed)
Immediate Anesthesia Transfer of Care Note  Patient: Tara Love  Procedure(s) Performed: Procedure(s) (LRB): CATARACT EXTRACTION PHACO AND INTRAOCULAR LENS PLACEMENT (IOC) (Left)  Patient Location: Shortstay  Anesthesia Type: MAC  Level of Consciousness: awake  Airway & Oxygen Therapy: Patient Spontanous Breathing   Post-op Assessment: Report given to PACU RN, Post -op Vital signs reviewed and stable and Patient moving all extremities  Post vital signs: Reviewed and stable  Complications: No apparent anesthesia complications

## 2016-07-14 NOTE — H&P (Signed)
I have reviewed the H&P, the patient was re-examined, and I have identified no interval changes in medical condition and plan of care since the history and physical of record  

## 2016-07-15 ENCOUNTER — Encounter (HOSPITAL_COMMUNITY): Payer: Self-pay | Admitting: Ophthalmology

## 2016-07-17 DIAGNOSIS — M545 Low back pain: Secondary | ICD-10-CM | POA: Diagnosis not present

## 2016-07-17 DIAGNOSIS — M5416 Radiculopathy, lumbar region: Secondary | ICD-10-CM | POA: Diagnosis not present

## 2016-07-18 DIAGNOSIS — M545 Low back pain: Secondary | ICD-10-CM | POA: Diagnosis not present

## 2016-07-18 DIAGNOSIS — M5416 Radiculopathy, lumbar region: Secondary | ICD-10-CM | POA: Diagnosis not present

## 2016-07-20 ENCOUNTER — Other Ambulatory Visit: Payer: Self-pay | Admitting: Cardiovascular Disease

## 2016-07-23 DIAGNOSIS — M545 Low back pain: Secondary | ICD-10-CM | POA: Diagnosis not present

## 2016-07-23 DIAGNOSIS — M5416 Radiculopathy, lumbar region: Secondary | ICD-10-CM | POA: Diagnosis not present

## 2016-07-24 DIAGNOSIS — M545 Low back pain: Secondary | ICD-10-CM | POA: Diagnosis not present

## 2016-07-24 DIAGNOSIS — M5416 Radiculopathy, lumbar region: Secondary | ICD-10-CM | POA: Diagnosis not present

## 2016-07-24 DIAGNOSIS — J439 Emphysema, unspecified: Secondary | ICD-10-CM | POA: Diagnosis not present

## 2016-07-24 DIAGNOSIS — J441 Chronic obstructive pulmonary disease with (acute) exacerbation: Secondary | ICD-10-CM | POA: Diagnosis not present

## 2016-07-24 DIAGNOSIS — G4733 Obstructive sleep apnea (adult) (pediatric): Secondary | ICD-10-CM | POA: Diagnosis not present

## 2016-07-24 DIAGNOSIS — J449 Chronic obstructive pulmonary disease, unspecified: Secondary | ICD-10-CM | POA: Diagnosis not present

## 2016-08-04 DIAGNOSIS — H2511 Age-related nuclear cataract, right eye: Secondary | ICD-10-CM | POA: Diagnosis not present

## 2016-08-05 DIAGNOSIS — M545 Low back pain: Secondary | ICD-10-CM | POA: Diagnosis not present

## 2016-08-05 DIAGNOSIS — M5416 Radiculopathy, lumbar region: Secondary | ICD-10-CM | POA: Diagnosis not present

## 2016-08-06 ENCOUNTER — Encounter (HOSPITAL_COMMUNITY)
Admission: RE | Admit: 2016-08-06 | Discharge: 2016-08-06 | Disposition: A | Discharge status: Home / Self Care | Payer: Medicare Other | Source: Ambulatory Visit | Attending: Ophthalmology | Admitting: Ophthalmology

## 2016-08-08 MED ORDER — TETRACAINE HCL 0.5 % OP SOLN
OPHTHALMIC | Status: AC
  Filled 2016-08-08: qty 4

## 2016-08-08 MED ORDER — NEOMYCIN-POLYMYXIN-DEXAMETH 3.5-10000-0.1 OP SUSP
OPHTHALMIC | Status: AC
  Filled 2016-08-08: qty 5

## 2016-08-08 MED ORDER — LIDOCAINE HCL (PF) 1 % IJ SOLN
INTRAMUSCULAR | Status: AC
  Filled 2016-08-08: qty 2

## 2016-08-08 MED ORDER — LIDOCAINE HCL 3.5 % OP GEL
OPHTHALMIC | Status: AC
Start: 2016-08-08 — End: ?
  Filled 2016-08-08: qty 1

## 2016-08-08 MED ORDER — PHENYLEPHRINE HCL 2.5 % OP SOLN
OPHTHALMIC | Status: AC
  Filled 2016-08-08: qty 15

## 2016-08-08 MED ORDER — CYCLOPENTOLATE-PHENYLEPHRINE 0.2-1 % OP SOLN
OPHTHALMIC | Status: AC
  Filled 2016-08-08: qty 2

## 2016-08-09 DIAGNOSIS — G4733 Obstructive sleep apnea (adult) (pediatric): Secondary | ICD-10-CM | POA: Diagnosis not present

## 2016-08-11 ENCOUNTER — Ambulatory Visit (HOSPITAL_COMMUNITY): Payer: Medicare Other | Admitting: Anesthesiology

## 2016-08-11 ENCOUNTER — Ambulatory Visit (HOSPITAL_COMMUNITY)
Admission: RE | Admit: 2016-08-11 | Discharge: 2016-08-11 | Disposition: A | Discharge status: Home / Self Care | Payer: Medicare Other | Source: Ambulatory Visit | Attending: Ophthalmology | Admitting: Ophthalmology

## 2016-08-11 ENCOUNTER — Encounter (HOSPITAL_COMMUNITY): Payer: Self-pay

## 2016-08-11 ENCOUNTER — Encounter (HOSPITAL_COMMUNITY)
Admission: RE | Disposition: A | Discharge status: Home / Self Care | Payer: Self-pay | Source: Ambulatory Visit | Attending: Ophthalmology

## 2016-08-11 DIAGNOSIS — I2510 Atherosclerotic heart disease of native coronary artery without angina pectoris: Secondary | ICD-10-CM | POA: Insufficient documentation

## 2016-08-11 DIAGNOSIS — H2512 Age-related nuclear cataract, left eye: Secondary | ICD-10-CM | POA: Diagnosis not present

## 2016-08-11 DIAGNOSIS — Z955 Presence of coronary angioplasty implant and graft: Secondary | ICD-10-CM | POA: Insufficient documentation

## 2016-08-11 DIAGNOSIS — Z87891 Personal history of nicotine dependence: Secondary | ICD-10-CM | POA: Insufficient documentation

## 2016-08-11 DIAGNOSIS — E669 Obesity, unspecified: Secondary | ICD-10-CM | POA: Insufficient documentation

## 2016-08-11 DIAGNOSIS — I739 Peripheral vascular disease, unspecified: Secondary | ICD-10-CM | POA: Insufficient documentation

## 2016-08-11 DIAGNOSIS — Z6836 Body mass index (BMI) 36.0-36.9, adult: Secondary | ICD-10-CM | POA: Diagnosis not present

## 2016-08-11 DIAGNOSIS — Z79899 Other long term (current) drug therapy: Secondary | ICD-10-CM | POA: Insufficient documentation

## 2016-08-11 DIAGNOSIS — H2511 Age-related nuclear cataract, right eye: Secondary | ICD-10-CM | POA: Diagnosis not present

## 2016-08-11 DIAGNOSIS — I10 Essential (primary) hypertension: Secondary | ICD-10-CM | POA: Diagnosis not present

## 2016-08-11 HISTORY — PX: CATARACT EXTRACTION W/PHACO: SHX586

## 2016-08-11 SURGERY — PHACOEMULSIFICATION, CATARACT, WITH IOL INSERTION
Anesthesia: Monitor Anesthesia Care | Site: Eye | Laterality: Right

## 2016-08-11 MED ORDER — LIDOCAINE HCL (PF) 1 % IJ SOLN
INTRAMUSCULAR | Status: DC | PRN
  Administered 2016-08-11: .4 mL

## 2016-08-11 MED ORDER — FENTANYL CITRATE (PF) 100 MCG/2ML IJ SOLN
25.0000 ug | Freq: Once | INTRAMUSCULAR | Status: AC
  Administered 2016-08-11: 25 ug via INTRAVENOUS

## 2016-08-11 MED ORDER — LACTATED RINGERS IV SOLN
INTRAVENOUS | Status: DC
  Administered 2016-08-11: 1000 mL via INTRAVENOUS

## 2016-08-11 MED ORDER — MIDAZOLAM HCL 2 MG/2ML IJ SOLN
INTRAMUSCULAR | Status: AC
  Filled 2016-08-11: qty 2

## 2016-08-11 MED ORDER — PHENYLEPHRINE HCL 2.5 % OP SOLN
1.0000 [drp] | OPHTHALMIC | Status: AC
  Administered 2016-08-11 (×4): 1 [drp] via OPHTHALMIC

## 2016-08-11 MED ORDER — BSS IO SOLN
INTRAOCULAR | Status: DC | PRN
  Administered 2016-08-11: 15 mL

## 2016-08-11 MED ORDER — FENTANYL CITRATE (PF) 100 MCG/2ML IJ SOLN
INTRAMUSCULAR | Status: AC
  Filled 2016-08-11: qty 2

## 2016-08-11 MED ORDER — TETRACAINE HCL 0.5 % OP SOLN
1.0000 [drp] | OPHTHALMIC | Status: AC
  Administered 2016-08-11 (×4): 1 [drp] via OPHTHALMIC

## 2016-08-11 MED ORDER — CYCLOPENTOLATE-PHENYLEPHRINE 0.2-1 % OP SOLN
1.0000 [drp] | OPHTHALMIC | Status: AC
  Administered 2016-08-11 (×4): 1 [drp] via OPHTHALMIC

## 2016-08-11 MED ORDER — PROVISC 10 MG/ML IO SOLN
INTRAOCULAR | Status: DC | PRN
  Administered 2016-08-11: 0.85 mL via INTRAOCULAR

## 2016-08-11 MED ORDER — POVIDONE-IODINE 5 % OP SOLN
OPHTHALMIC | Status: DC | PRN
  Administered 2016-08-11: 1 via OPHTHALMIC

## 2016-08-11 MED ORDER — EPINEPHRINE PF 1 MG/ML IJ SOLN
INTRAOCULAR | Status: DC | PRN
  Administered 2016-08-11: 500 mL

## 2016-08-11 MED ORDER — LIDOCAINE HCL 3.5 % OP GEL
1.0000 "application " | Freq: Once | OPHTHALMIC | Status: AC
  Administered 2016-08-11: 1 via OPHTHALMIC

## 2016-08-11 MED ORDER — MIDAZOLAM HCL 2 MG/2ML IJ SOLN
1.0000 mg | INTRAMUSCULAR | Status: AC
  Administered 2016-08-11: 2 mg via INTRAVENOUS

## 2016-08-11 MED ORDER — NEOMYCIN-POLYMYXIN-DEXAMETH 3.5-10000-0.1 OP SUSP
OPHTHALMIC | Status: DC | PRN
  Administered 2016-08-11: 2 [drp] via OPHTHALMIC

## 2016-08-11 SURGICAL SUPPLY — 11 items
CLOTH BEACON ORANGE TIMEOUT ST (SAFETY) ×3 IMPLANT
EYE SHIELD UNIVERSAL CLEAR (GAUZE/BANDAGES/DRESSINGS) ×6 IMPLANT
GLOVE BIOGEL PI IND STRL 7.0 (GLOVE) ×1 IMPLANT
GLOVE BIOGEL PI INDICATOR 7.0 (GLOVE) ×2
GLOVE EXAM NITRILE MD LF STRL (GLOVE) ×3 IMPLANT
PAD ARMBOARD 7.5X6 YLW CONV (MISCELLANEOUS) ×3 IMPLANT
SIGHTPATH CAT PROC W REG LENS (Ophthalmic Related) ×3 IMPLANT
SYRINGE LUER LOK 1CC (MISCELLANEOUS) ×3 IMPLANT
TAPE SURG TRANSPORE 1 IN (GAUZE/BANDAGES/DRESSINGS) ×1 IMPLANT
TAPE SURGICAL TRANSPORE 1 IN (GAUZE/BANDAGES/DRESSINGS) ×2
WATER STERILE IRR 250ML POUR (IV SOLUTION) ×3 IMPLANT

## 2016-08-11 NOTE — Anesthesia Postprocedure Evaluation (Signed)
Anesthesia Post Note  Patient: Tara Love  Procedure(s) Performed: Procedure(s) (LRB): CATARACT EXTRACTION PHACO AND INTRAOCULAR LENS PLACEMENT (IOC) (Right)  Patient location during evaluation: Short Stay Anesthesia Type: MAC Level of consciousness: awake and alert and oriented Pain management: pain level controlled Vital Signs Assessment: post-procedure vital signs reviewed and stable Respiratory status: spontaneous breathing Cardiovascular status: stable Postop Assessment: no signs of nausea or vomiting Anesthetic complications: no     Last Vitals:  Vitals:   08/11/16 0755 08/11/16 0800  BP:  104/62  Pulse:    Resp: (!) 26 (!) 29  Temp:      Last Pain:  Vitals:   08/11/16 0702  TempSrc: Oral                 ADAMS, AMY A

## 2016-08-11 NOTE — Transfer of Care (Signed)
Immediate Anesthesia Transfer of Care Note  Patient: Tara Love  Procedure(s) Performed: Procedure(s) with comments: CATARACT EXTRACTION PHACO AND INTRAOCULAR LENS PLACEMENT (IOC) (Right) - CDE: 11.58  Patient Location: Short Stay  Anesthesia Type:MAC  Level of Consciousness: awake, alert , oriented and patient cooperative  Airway & Oxygen Therapy: Patient Spontanous Breathing  Post-op Assessment: Report given to RN and Post -op Vital signs reviewed and stable  Post vital signs: Reviewed and stable  Last Vitals:  Vitals:   08/11/16 0755 08/11/16 0800  BP:  104/62  Pulse:    Resp: (!) 26 (!) 29  Temp:      Last Pain:  Vitals:   08/11/16 0702  TempSrc: Oral         Complications: No apparent anesthesia complications

## 2016-08-11 NOTE — Discharge Instructions (Signed)

## 2016-08-11 NOTE — H&P (Signed)
I have reviewed the H&P, the patient was re-examined, and I have identified no interval changes in medical condition and plan of care since the history and physical of record  

## 2016-08-11 NOTE — Anesthesia Preprocedure Evaluation (Signed)
Anesthesia Evaluation  Patient identified by MRN, date of birth, ID band Patient awake    Reviewed: Allergy & Precautions, NPO status , Patient's Chart, lab work & pertinent test results  Airway Mallampati: I  TM Distance: >3 FB     Dental  (+) Edentulous Upper, Partial Lower   Pulmonary former smoker,    breath sounds clear to auscultation       Cardiovascular hypertension, Pt. on medications and Pt. on home beta blockers + CAD, + Cardiac Stents and + Peripheral Vascular Disease   Rhythm:Regular Rate:Normal     Neuro/Psych    GI/Hepatic   Endo/Other  Morbid obesity  Renal/GU      Musculoskeletal   Abdominal   Peds  Hematology   Anesthesia Other Findings   Reproductive/Obstetrics                             Anesthesia Physical Anesthesia Plan  ASA: III  Anesthesia Plan: MAC   Post-op Pain Management:    Induction: Intravenous  Airway Management Planned:   Additional Equipment:   Intra-op Plan:   Post-operative Plan:   Informed Consent: I have reviewed the patients History and Physical, chart, labs and discussed the procedure including the risks, benefits and alternatives for the proposed anesthesia with the patient or authorized representative who has indicated his/her understanding and acceptance.     Plan Discussed with:   Anesthesia Plan Comments:         Anesthesia Quick Evaluation

## 2016-08-11 NOTE — Anesthesia Procedure Notes (Signed)
Procedure Name: MAC Performed by: Matthews Franks A Pre-anesthesia Checklist: Timeout performed, Patient identified, Emergency Drugs available, Suction available and Patient being monitored Oxygen Delivery Method: Nasal cannula

## 2016-08-11 NOTE — Op Note (Signed)
Date of Admission: 08/11/2016  Date of Surgery: 08/11/2016  Pre-Op Dx: Cataract Right  Eye  Post-Op Dx: Senile Nuclear Cataract  Right  Eye,  Dx Code H25.11  Surgeon: Tonny Branch, M.D.  Assistants: None  Anesthesia: Topical with MAC  Indications: Painless, progressive loss of vision with compromise of daily activities.  Surgery: Cataract Extraction with Intraocular lens Implant Right Eye  Discription: The patient had dilating drops and viscous lidocaine placed into the Right eye in the pre-op holding area. After transfer to the operating room, a time out was performed. The patient was then prepped and draped. Beginning with a 37 degree blade a paracentesis port was made at the surgeon's 2 o'clock position. The anterior chamber was then filled with 1% non-preserved lidocaine. This was followed by filling the anterior chamber with Provisc.  A 2.20m keratome blade was used to make a clear corneal incision at the temporal limbus.  A bent cystatome needle was used to create a continuous tear capsulotomy. Hydrodissection was performed with balanced salt solution on a Fine canula. The lens nucleus was then removed using the phacoemulsification handpiece. Residual cortex was removed with the I&A handpiece. The anterior chamber and capsular bag were refilled with Provisc. A posterior chamber intraocular lens was placed into the capsular bag with it's injector. The implant was positioned with the Kuglan hook. The Provisc was then removed from the anterior chamber and capsular bag with the I&A handpiece. Stromal hydration of the main incision and paracentesis port was performed with BSS on a Fine canula. The wounds were tested for leak which was negative. The patient tolerated the procedure well. There were no operative complications. The patient was then transferred to the recovery room in stable condition.  Complications: None  Specimen: None  EBL: None  Prosthetic device: Abbott Technis, PCB00, power  15.0, SN [id number]

## 2016-08-13 ENCOUNTER — Ambulatory Visit (INDEPENDENT_AMBULATORY_CARE_PROVIDER_SITE_OTHER): Payer: Medicare Other | Admitting: Cardiovascular Disease

## 2016-08-13 ENCOUNTER — Encounter (HOSPITAL_COMMUNITY): Payer: Self-pay | Admitting: Ophthalmology

## 2016-08-13 VITALS — BP 102/72 | HR 69 | Ht 65.5 in | Wt 223.0 lb

## 2016-08-13 DIAGNOSIS — Z8673 Personal history of transient ischemic attack (TIA), and cerebral infarction without residual deficits: Secondary | ICD-10-CM

## 2016-08-13 DIAGNOSIS — Z955 Presence of coronary angioplasty implant and graft: Secondary | ICD-10-CM

## 2016-08-13 DIAGNOSIS — I10 Essential (primary) hypertension: Secondary | ICD-10-CM | POA: Diagnosis not present

## 2016-08-13 DIAGNOSIS — I25118 Atherosclerotic heart disease of native coronary artery with other forms of angina pectoris: Secondary | ICD-10-CM

## 2016-08-13 DIAGNOSIS — G4733 Obstructive sleep apnea (adult) (pediatric): Secondary | ICD-10-CM | POA: Diagnosis not present

## 2016-08-13 DIAGNOSIS — Z9989 Dependence on other enabling machines and devices: Secondary | ICD-10-CM | POA: Diagnosis not present

## 2016-08-13 DIAGNOSIS — E785 Hyperlipidemia, unspecified: Secondary | ICD-10-CM | POA: Diagnosis not present

## 2016-08-13 DIAGNOSIS — Z23 Encounter for immunization: Secondary | ICD-10-CM | POA: Diagnosis not present

## 2016-08-13 MED ORDER — ISOSORBIDE MONONITRATE ER 30 MG PO TB24: 30.0000 mg | ORAL_TABLET | Freq: Every day | ORAL | 3 refills | Status: DC

## 2016-08-13 NOTE — Patient Instructions (Signed)
Your physician wants you to follow-up in: Tara Love. You will receive a reminder letter in the mail two months in advance. If you don't receive a letter, please call our office to schedule the follow-up appointment.  Your physician has recommended you make the following change in your medication: BEGIN TAKING IMDUR 30 MG DAILY  IF YOU NEED REFILLS ON ANY OF YOUR CARDIAC MEDICATIONS, PLEASE LET [place].  Thank you for choosing [facility]!

## 2016-08-13 NOTE — Progress Notes (Signed)
SUBJECTIVE: The patient since for follow-up of coronary artery disease. She also has a history of sleep apnea, hypertension, hyperlipidemia, TIA, obesity, and asthma.  She has lost about 10 pounds since her last visit with me in September.  She recently had bilateral cataract removal.  With respect to coronary artery disease, she denies chest pain and shortness of breath. Her blood pressure has remained well controlled.  She would like to come off of some of her medications.   Review of Systems: As per "subjective", otherwise negative.  Allergies  Allergen Reactions  . Codeine Phosphate     REACTION: nausea  . Morphine Sulfate     REACTION: nausea    Current Outpatient Prescriptions  Medication Sig Dispense Refill  . acetaminophen (TYLENOL) 500 MG tablet Take 500 mg by mouth every 6 (six) hours as needed for pain. For pain in back and knee    . clopidogrel (PLAVIX) 75 MG tablet TAKE ONE TABLET BY MOUTH ONCE DAILY *STOP ASPIRIN, MEDICATION CHANGED 08/10/15* 30 tablet 6  . Difluprednate (DUREZOL) 0.05 % EMUL Apply to eye 3 (three) times daily.    . furosemide (LASIX) 20 MG tablet Take 20 mg by mouth daily as needed for fluid.     . isosorbide mononitrate (IMDUR) 60 MG 24 hr tablet TAKE ONE TABLET BY MOUTH ONCE DAILY 30 tablet 6  . ketorolac (ACULAR) 0.5 % ophthalmic solution Place 1 drop into the right eye 4 (four) times daily.    . magnesium oxide (MAG-OX) 400 MG tablet Take 400 mg by mouth daily.    . metoprolol succinate (TOPROL-XL) 50 MG 24 hr tablet Take 1 tablet (50 mg total) by mouth every morning.    . moxifloxacin (VIGAMOX) 0.5 % ophthalmic solution Place 1 drop into the right eye 3 (three) times daily.    . nitroGLYCERIN (NITROSTAT) 0.4 MG SL tablet Place 1 tablet (0.4 mg total) under the tongue every 5 (five) minutes as needed for chest pain (up to 3 doses).    . ranolazine (RANEXA) 1000 MG SR tablet Take 1 tablet (1,000 mg total) by mouth 2 (two) times daily. 60  tablet 6  . simvastatin (ZOCOR) 40 MG tablet Take 40 mg by mouth at bedtime.       No current facility-administered medications for this visit.     Past Medical History:  Diagnosis Date  . CAD (coronary artery disease)    a. DES to [place] 06/2007. b. Cath 01/2013 due to abnormal nuc at [facility]: nonobst dz, patent stent, EF 55-65%.  . Iron deficiency anemia, unspecified   . Irritable bowel syndrome   . Morbid obesity ([place])   . Other and unspecified hyperlipidemia   . Unspecified asthma(493.90)   . Unspecified essential hypertension     Past Surgical History:  Procedure Laterality Date  . CARPAL TUNNEL RELEASE     Bilateral  . CATARACT EXTRACTION W/PHACO Left 07/14/2016   Procedure: CATARACT EXTRACTION PHACO AND INTRAOCULAR LENS PLACEMENT (IOC);  Surgeon: Tonny Branch, MD;  Location: [facility];  Service: Ophthalmology;  Laterality: Left;  CDE: 7.04  . CATARACT EXTRACTION W/PHACO Right 08/11/2016   Procedure: CATARACT EXTRACTION PHACO AND INTRAOCULAR LENS PLACEMENT (IOC);  Surgeon: Tonny Branch, MD;  Location: [facility];  Service: Ophthalmology;  Laterality: Right;  CDE: 11.58  . CHOLECYSTECTOMY OPEN  1987  . DILATION AND CURETTAGE OF UTERUS    . GASTRIC BYPASS SURGERY  1993   Subtotal gastrectomy  . INCISIONAL HERNIA REPAIR  01/25/2003  with mesh  . LEFT HEART CATHETERIZATION WITH CORONARY ANGIOGRAM N/A 02/15/2013   Procedure: LEFT HEART CATHETERIZATION WITH CORONARY ANGIOGRAM;  Surgeon: Peter M Martinique, MD;  [facility] C[facility] LAB;  Service: Cardiovascular;  Laterality: N/A;  . TONSILLECTOMY     as a child    Social History   Social History  . Marital status: Divorced    Spouse name: N/A  . Number of children: 2  . Years of education: N/A   Occupational History  . RETIRED    Social History Main Topics  . Smoking status: Former Smoker    Packs/day: 2.00    Years: 18.00    Types: Cigarettes    Start date: 05/20/1947    Quit date: 05/19/1965  . Smokeless tobacco: Former Systems developer    Types:  Snuff, Sarina Ser    Quit date: 05/19/1946     Comment: Started smoking at age 11-has not smoked in 49 years/dipped snuff and chewed tobacco during childhood  . Alcohol use No  . Drug use: No  . Sexual activity: Not on file   Other Topics Concern  . Not on file   Social History Narrative  . No narrative on file     Vitals:   08/13/16 1349  BP: 102/72  Pulse: 69  SpO2: 96%  Weight: 223 lb (101.2 kg)  Height: 5' 5.5" (1.664 m)    PHYSICAL EXAM General: NAD HEENT: Normal. Neck: No JVD, no thyromegaly. Lungs: Clear to auscultation bilaterally with normal respiratory effort. CV: Nondisplaced PMI.  Regular rate and rhythm, normal S1/S2, no S3/S4, no murmur. No pretibial or periankle edema.  No carotid bruit.   Abdomen: Soft, nontender, obese, no distention.  Neurologic: Alert and oriented.  Psych: Normal affect. Skin: Normal. Musculoskeletal: No gross deformities.    ECG: Most recent ECG reviewed.      ASSESSMENT AND PLAN: 1. Chest pain/CAD: She remains symptomatically stable from this standpoint. Coronary angiography on 02/15/13 showed nonobstructive disease and a patent stent. Continue Toprol-XL, Ranexa, Plavix, Imdur (reduce to 30 mg as per her preference), and simvastatin.  2. Essential HTN: This remains well controlled. Monitor given reduction of Imdur dose.  3. Hyperlipidemia: Continue statin therapy.  4. CVA: Continue Plavix.  5. OSA: On CPAP.  Dispo: f/u 1 yr   Kate Sable, M.D., F.A.C.C.

## 2016-09-01 DIAGNOSIS — H2512 Age-related nuclear cataract, left eye: Secondary | ICD-10-CM | POA: Diagnosis not present

## 2016-09-09 DIAGNOSIS — G4733 Obstructive sleep apnea (adult) (pediatric): Secondary | ICD-10-CM | POA: Diagnosis not present

## 2016-09-18 ENCOUNTER — Other Ambulatory Visit: Payer: Self-pay | Admitting: Cardiovascular Disease

## 2016-10-07 DIAGNOSIS — J4541 Moderate persistent asthma with (acute) exacerbation: Secondary | ICD-10-CM | POA: Diagnosis not present

## 2016-10-07 DIAGNOSIS — J209 Acute bronchitis, unspecified: Secondary | ICD-10-CM | POA: Diagnosis not present

## 2016-10-09 DIAGNOSIS — G4733 Obstructive sleep apnea (adult) (pediatric): Secondary | ICD-10-CM | POA: Diagnosis not present

## 2016-10-14 DIAGNOSIS — I10 Essential (primary) hypertension: Secondary | ICD-10-CM | POA: Diagnosis not present

## 2016-10-14 DIAGNOSIS — E782 Mixed hyperlipidemia: Secondary | ICD-10-CM | POA: Diagnosis not present

## 2016-10-14 DIAGNOSIS — N183 Chronic kidney disease, stage 3 (moderate): Secondary | ICD-10-CM | POA: Diagnosis not present

## 2016-10-14 DIAGNOSIS — Z9189 Other specified personal risk factors, not elsewhere classified: Secondary | ICD-10-CM | POA: Diagnosis not present

## 2016-10-14 DIAGNOSIS — E162 Hypoglycemia, unspecified: Secondary | ICD-10-CM | POA: Diagnosis not present

## 2016-10-17 DIAGNOSIS — J4520 Mild intermittent asthma, uncomplicated: Secondary | ICD-10-CM | POA: Diagnosis not present

## 2016-10-17 DIAGNOSIS — I10 Essential (primary) hypertension: Secondary | ICD-10-CM | POA: Diagnosis not present

## 2016-10-17 DIAGNOSIS — E782 Mixed hyperlipidemia: Secondary | ICD-10-CM | POA: Diagnosis not present

## 2016-10-17 DIAGNOSIS — N183 Chronic kidney disease, stage 3 (moderate): Secondary | ICD-10-CM | POA: Diagnosis not present

## 2016-10-17 DIAGNOSIS — I2510 Atherosclerotic heart disease of native coronary artery without angina pectoris: Secondary | ICD-10-CM | POA: Diagnosis not present

## 2016-11-04 DIAGNOSIS — J449 Chronic obstructive pulmonary disease, unspecified: Secondary | ICD-10-CM | POA: Diagnosis not present

## 2016-11-04 DIAGNOSIS — J441 Chronic obstructive pulmonary disease with (acute) exacerbation: Secondary | ICD-10-CM | POA: Diagnosis not present

## 2016-11-04 DIAGNOSIS — G4733 Obstructive sleep apnea (adult) (pediatric): Secondary | ICD-10-CM | POA: Diagnosis not present

## 2016-11-04 DIAGNOSIS — J439 Emphysema, unspecified: Secondary | ICD-10-CM | POA: Diagnosis not present

## 2016-11-17 DIAGNOSIS — R1084 Generalized abdominal pain: Secondary | ICD-10-CM | POA: Diagnosis not present

## 2016-11-24 DIAGNOSIS — R1084 Generalized abdominal pain: Secondary | ICD-10-CM | POA: Diagnosis not present

## 2016-11-25 DIAGNOSIS — I700 Atherosclerosis of aorta: Secondary | ICD-10-CM | POA: Diagnosis not present

## 2016-11-25 DIAGNOSIS — Z9884 Bariatric surgery status: Secondary | ICD-10-CM | POA: Diagnosis not present

## 2016-11-25 DIAGNOSIS — R109 Unspecified abdominal pain: Secondary | ICD-10-CM | POA: Diagnosis not present

## 2016-11-25 DIAGNOSIS — Z9049 Acquired absence of other specified parts of digestive tract: Secondary | ICD-10-CM | POA: Diagnosis not present

## 2016-11-25 DIAGNOSIS — I2510 Atherosclerotic heart disease of native coronary artery without angina pectoris: Secondary | ICD-10-CM | POA: Diagnosis not present

## 2016-11-25 DIAGNOSIS — R160 Hepatomegaly, not elsewhere classified: Secondary | ICD-10-CM | POA: Diagnosis not present

## 2016-11-25 DIAGNOSIS — R1084 Generalized abdominal pain: Secondary | ICD-10-CM | POA: Diagnosis not present

## 2016-12-10 DIAGNOSIS — R1084 Generalized abdominal pain: Secondary | ICD-10-CM | POA: Diagnosis not present

## 2017-02-06 DIAGNOSIS — G4733 Obstructive sleep apnea (adult) (pediatric): Secondary | ICD-10-CM | POA: Diagnosis not present

## 2017-02-06 DIAGNOSIS — J441 Chronic obstructive pulmonary disease with (acute) exacerbation: Secondary | ICD-10-CM | POA: Diagnosis not present

## 2017-02-06 DIAGNOSIS — J449 Chronic obstructive pulmonary disease, unspecified: Secondary | ICD-10-CM | POA: Diagnosis not present

## 2017-02-06 DIAGNOSIS — J439 Emphysema, unspecified: Secondary | ICD-10-CM | POA: Diagnosis not present

## 2017-03-03 DIAGNOSIS — I10 Essential (primary) hypertension: Secondary | ICD-10-CM | POA: Diagnosis not present

## 2017-03-03 DIAGNOSIS — E162 Hypoglycemia, unspecified: Secondary | ICD-10-CM | POA: Diagnosis not present

## 2017-03-03 DIAGNOSIS — E782 Mixed hyperlipidemia: Secondary | ICD-10-CM | POA: Diagnosis not present

## 2017-03-03 DIAGNOSIS — N183 Chronic kidney disease, stage 3 (moderate): Secondary | ICD-10-CM | POA: Diagnosis not present

## 2017-03-03 DIAGNOSIS — Z9189 Other specified personal risk factors, not elsewhere classified: Secondary | ICD-10-CM | POA: Diagnosis not present

## 2017-03-05 DIAGNOSIS — Z0001 Encounter for general adult medical examination with abnormal findings: Secondary | ICD-10-CM | POA: Diagnosis not present

## 2017-03-05 DIAGNOSIS — N183 Chronic kidney disease, stage 3 (moderate): Secondary | ICD-10-CM | POA: Diagnosis not present

## 2017-03-05 DIAGNOSIS — Z1212 Encounter for screening for malignant neoplasm of rectum: Secondary | ICD-10-CM | POA: Diagnosis not present

## 2017-03-05 DIAGNOSIS — I10 Essential (primary) hypertension: Secondary | ICD-10-CM | POA: Diagnosis not present

## 2017-04-30 ENCOUNTER — Ambulatory Visit: Payer: Medicare Other | Admitting: Cardiovascular Disease

## 2017-05-13 DIAGNOSIS — G4733 Obstructive sleep apnea (adult) (pediatric): Secondary | ICD-10-CM | POA: Diagnosis not present

## 2017-05-13 DIAGNOSIS — J441 Chronic obstructive pulmonary disease with (acute) exacerbation: Secondary | ICD-10-CM | POA: Diagnosis not present

## 2017-05-13 DIAGNOSIS — J449 Chronic obstructive pulmonary disease, unspecified: Secondary | ICD-10-CM | POA: Diagnosis not present

## 2017-05-13 DIAGNOSIS — J439 Emphysema, unspecified: Secondary | ICD-10-CM | POA: Diagnosis not present

## 2017-08-03 ENCOUNTER — Ambulatory Visit (INDEPENDENT_AMBULATORY_CARE_PROVIDER_SITE_OTHER): Payer: Medicare Other | Admitting: Cardiovascular Disease

## 2017-08-03 ENCOUNTER — Encounter: Payer: Self-pay | Admitting: Cardiovascular Disease

## 2017-08-03 VITALS — BP 110/64 | HR 72 | Ht 65.5 in | Wt 218.0 lb

## 2017-08-03 DIAGNOSIS — Z955 Presence of coronary angioplasty implant and graft: Secondary | ICD-10-CM

## 2017-08-03 DIAGNOSIS — I25118 Atherosclerotic heart disease of native coronary artery with other forms of angina pectoris: Secondary | ICD-10-CM

## 2017-08-03 DIAGNOSIS — E785 Hyperlipidemia, unspecified: Secondary | ICD-10-CM | POA: Diagnosis not present

## 2017-08-03 DIAGNOSIS — Z8673 Personal history of transient ischemic attack (TIA), and cerebral infarction without residual deficits: Secondary | ICD-10-CM

## 2017-08-03 DIAGNOSIS — I10 Essential (primary) hypertension: Secondary | ICD-10-CM | POA: Diagnosis not present

## 2017-08-03 NOTE — Progress Notes (Signed)
hah

## 2017-08-03 NOTE — Progress Notes (Signed)
SUBJECTIVE: The patient presents for annual follow-up of coronary artery disease. She also has a history of sleep apnea, hypertension, hyperlipidemia, TIA, obesity, and asthma. Her last coronary angiogram was in September 2014 and demonstrated a patent stent in the diagonal and otherwise nonobstructive disease.  She underwent drug-eluting stent placement to the diagonal in February 2009.  ECG performed today which I reviewed demonstrated sinus rhythm with baseline undulating artifact.  There were no significant ischemic abnormalities nor any arrhythmias.  The patient denies any symptoms of chest pain, palpitations, shortness of breath, lightheadedness, dizziness, leg swelling, orthopnea, PND, and syncope.  She wants to get under 200 pounds.  She weighs 218 pounds on our scale but weighs 214 pounds on her scale.    Review of Systems: As per "subjective", otherwise negative.  Allergies  Allergen Reactions  . Codeine Phosphate     REACTION: nausea  . Morphine Sulfate     REACTION: nausea    Current Outpatient Medications  Medication Sig Dispense Refill  . acetaminophen (TYLENOL) 500 MG tablet Take 500 mg by mouth every 6 (six) hours as needed for pain. For pain in back and knee    . clopidogrel (PLAVIX) 75 MG tablet TAKE ONE TABLET BY MOUTH ONCE DAILY *STOP ASPIRIN, MEDICATION CHANGED 08/10/15* 30 tablet 6  . Difluprednate (DUREZOL) 0.05 % EMUL Apply to eye 3 (three) times daily.    . furosemide (LASIX) 20 MG tablet Take 20 mg by mouth daily as needed for fluid.     . isosorbide mononitrate (IMDUR) 30 MG 24 hr tablet Take 1 tablet (30 mg total) by mouth daily. 90 tablet 3  . ketorolac (ACULAR) 0.5 % ophthalmic solution Place 1 drop into the right eye 4 (four) times daily.    . metoprolol succinate (TOPROL-XL) 50 MG 24 hr tablet Take 1 tablet (50 mg total) by mouth every morning.    . moxifloxacin (VIGAMOX) 0.5 % ophthalmic solution Place 1 drop into the right eye 3 (three) times  daily.    . nitroGLYCERIN (NITROSTAT) 0.4 MG SL tablet Place 1 tablet (0.4 mg total) under the tongue every 5 (five) minutes as needed for chest pain (up to 3 doses).    . RANEXA 1000 MG SR tablet TAKE ONE TABLET BY MOUTH TWICE DAILY 60 tablet 6  . simvastatin (ZOCOR) 40 MG tablet Take 40 mg by mouth at bedtime.       No current facility-administered medications for this visit.     Past Medical History:  Diagnosis Date  . CAD (coronary artery disease)    a. DES to [place] 06/2007. b. Cath 01/2013 due to abnormal nuc at [facility]: nonobst dz, patent stent, EF 55-65%.  . Iron deficiency anemia, unspecified   . Irritable bowel syndrome   . Morbid obesity ([place])   . Other and unspecified hyperlipidemia   . Unspecified asthma(493.90)   . Unspecified essential hypertension     Past Surgical History:  Procedure Laterality Date  . CARPAL TUNNEL RELEASE     Bilateral  . CATARACT EXTRACTION W/PHACO Left 07/14/2016   Procedure: CATARACT EXTRACTION PHACO AND INTRAOCULAR LENS PLACEMENT (IOC);  Surgeon: Tonny Branch, MD;  Location: [facility];  Service: Ophthalmology;  Laterality: Left;  CDE: 7.04  . CATARACT EXTRACTION W/PHACO Right 08/11/2016   Procedure: CATARACT EXTRACTION PHACO AND INTRAOCULAR LENS PLACEMENT (IOC);  Surgeon: Tonny Branch, MD;  Location: [facility];  Service: Ophthalmology;  Laterality: Right;  CDE: 11.58  . CHOLECYSTECTOMY OPEN  1987  .  DILATION AND CURETTAGE OF UTERUS    . GASTRIC BYPASS SURGERY  1993   Subtotal gastrectomy  . INCISIONAL HERNIA REPAIR  01/25/2003   with mesh  . LEFT HEART CATHETERIZATION WITH CORONARY ANGIOGRAM N/A 02/15/2013   Procedure: LEFT HEART CATHETERIZATION WITH CORONARY ANGIOGRAM;  Surgeon: Peter M Martinique, MD;  Location: [facility] CATH LAB;  Service: Cardiovascular;  Laterality: N/A;  . TONSILLECTOMY     as a child    Social History   Socioeconomic History  . Marital status: Divorced    Spouse name: Not on file  . Number of children: 2  . Years of education: Not on  file  . Highest education level: Not on file  Social Needs  . Financial resource strain: Not on file  . Food insecurity - worry: Not on file  . Food insecurity - inability: Not on file  . Transportation needs - medical: Not on file  . Transportation needs - non-medical: Not on file  Occupational History  . Occupation: RETIRED  Tobacco Use  . Smoking status: Former Smoker    Packs/day: 2.00    Years: 18.00    Pack years: 36.00    Types: Cigarettes    Start date: 05/20/1947    Last attempt to quit: 05/19/1965    Years since quitting: 52.2  . Smokeless tobacco: Former Systems developer    Types: Snuff, Chew    Quit date: 05/19/1946  . Tobacco comment: Started smoking at age 53-has not smoked in 76 years/dipped snuff and chewed tobacco during childhood  Substance and Sexual Activity  . Alcohol use: No    Alcohol/week: 0.0 oz  . Drug use: No  . Sexual activity: Not on file  Other Topics Concern  . Not on file  Social History Narrative  . Not on file     Vitals:   08/03/17 1257  BP: 110/64  Pulse: 72  SpO2: 97%  Weight: 218 lb (98.9 kg)  Height: 5' 5.5" (1.664 m)    Wt Readings from Last 3 Encounters:  08/03/17 218 lb (98.9 kg)  08/13/16 223 lb (101.2 kg)  08/11/16 222 lb (100.7 kg)     PHYSICAL EXAM General: NAD HEENT: Normal. Neck: No JVD, no thyromegaly. Lungs: Clear to auscultation bilaterally with normal respiratory effort. CV: Regular rate and rhythm, normal S1/S2, no S3/S4, no murmur. No pretibial or periankle edema.  No carotid bruit.   Abdomen: Soft, nontender, no distention.  Neurologic: Alert and oriented.  Psych: Normal affect. Skin: Normal. Musculoskeletal: No gross deformities.    ECG: Most recent ECG reviewed.   Labs: Lab Results  Component Value Date/Time   K 4.6 07/09/2016 10:11 AM   BUN 10 07/09/2016 10:11 AM   CREATININE 0.96 07/09/2016 10:11 AM   ALT 13 07/07/2007 08:45 AM   HGB 12.8 07/09/2016 10:11 AM     Lipids: Lab Results  Component Value  Date/Time   LDLCALC 31 02/15/2013 05:00 AM   CHOL 125 02/15/2013 05:00 AM   TRIG 227 (H) 02/15/2013 05:00 AM   HDL 49 02/15/2013 05:00 AM       ASSESSMENT AND PLAN: 1.  Coronary disease: Symptomatically stable.  She is on Plavix for a history of CVA.  Continue Imdur, metoprolol succinate, Ranexa, and simvastatin.  2.  Chronic hypertension: Blood pressure is controlled.  No changes to therapy.  3.  Hyperlipidemia: Continue simvastatin 40 mg.  I will obtain a copy of lipids from PCP.  4.  History of CVA: On Plavix and statin.  5.  Obstructive sleep apnea: On CPAP.   Disposition: Follow up 1 year   Kate Sable, M.D., F.A.C.C.

## 2017-08-03 NOTE — Patient Instructions (Signed)

## 2017-08-13 ENCOUNTER — Encounter: Payer: Self-pay | Admitting: *Deleted

## 2017-08-13 DIAGNOSIS — E782 Mixed hyperlipidemia: Secondary | ICD-10-CM | POA: Diagnosis not present

## 2017-08-13 DIAGNOSIS — I10 Essential (primary) hypertension: Secondary | ICD-10-CM | POA: Diagnosis not present

## 2017-08-13 DIAGNOSIS — N183 Chronic kidney disease, stage 3 (moderate): Secondary | ICD-10-CM | POA: Diagnosis not present

## 2017-08-13 DIAGNOSIS — E162 Hypoglycemia, unspecified: Secondary | ICD-10-CM | POA: Diagnosis not present

## 2017-08-13 DIAGNOSIS — Z9189 Other specified personal risk factors, not elsewhere classified: Secondary | ICD-10-CM | POA: Diagnosis not present

## 2017-08-17 DIAGNOSIS — N183 Chronic kidney disease, stage 3 (moderate): Secondary | ICD-10-CM | POA: Diagnosis not present

## 2017-08-17 DIAGNOSIS — I10 Essential (primary) hypertension: Secondary | ICD-10-CM | POA: Diagnosis not present

## 2017-08-17 DIAGNOSIS — E782 Mixed hyperlipidemia: Secondary | ICD-10-CM | POA: Diagnosis not present

## 2017-08-17 DIAGNOSIS — I2510 Atherosclerotic heart disease of native coronary artery without angina pectoris: Secondary | ICD-10-CM | POA: Diagnosis not present

## 2017-08-17 DIAGNOSIS — J4520 Mild intermittent asthma, uncomplicated: Secondary | ICD-10-CM | POA: Diagnosis not present

## 2017-09-16 ENCOUNTER — Other Ambulatory Visit: Payer: Self-pay | Admitting: Cardiovascular Disease

## 2017-11-27 ENCOUNTER — Other Ambulatory Visit: Payer: Self-pay | Admitting: Cardiovascular Disease

## 2017-12-30 DIAGNOSIS — R5383 Other fatigue: Secondary | ICD-10-CM | POA: Diagnosis not present

## 2017-12-30 DIAGNOSIS — I10 Essential (primary) hypertension: Secondary | ICD-10-CM | POA: Diagnosis not present

## 2017-12-30 DIAGNOSIS — E782 Mixed hyperlipidemia: Secondary | ICD-10-CM | POA: Diagnosis not present

## 2017-12-30 DIAGNOSIS — E559 Vitamin D deficiency, unspecified: Secondary | ICD-10-CM | POA: Diagnosis not present

## 2017-12-30 DIAGNOSIS — D511 Vitamin B12 deficiency anemia due to selective vitamin B12 malabsorption with proteinuria: Secondary | ICD-10-CM | POA: Diagnosis not present

## 2018-01-05 DIAGNOSIS — R0902 Hypoxemia: Secondary | ICD-10-CM | POA: Diagnosis not present

## 2018-01-11 DIAGNOSIS — L247 Irritant contact dermatitis due to plants, except food: Secondary | ICD-10-CM | POA: Diagnosis not present

## 2018-01-14 DIAGNOSIS — L247 Irritant contact dermatitis due to plants, except food: Secondary | ICD-10-CM | POA: Diagnosis not present

## 2018-03-11 DIAGNOSIS — Z23 Encounter for immunization: Secondary | ICD-10-CM | POA: Diagnosis not present

## 2018-03-11 DIAGNOSIS — J4520 Mild intermittent asthma, uncomplicated: Secondary | ICD-10-CM | POA: Diagnosis not present

## 2018-03-11 DIAGNOSIS — D511 Vitamin B12 deficiency anemia due to selective vitamin B12 malabsorption with proteinuria: Secondary | ICD-10-CM | POA: Diagnosis not present

## 2018-03-11 DIAGNOSIS — Z0001 Encounter for general adult medical examination with abnormal findings: Secondary | ICD-10-CM | POA: Diagnosis not present

## 2018-03-11 DIAGNOSIS — E782 Mixed hyperlipidemia: Secondary | ICD-10-CM | POA: Diagnosis not present

## 2018-03-11 DIAGNOSIS — I10 Essential (primary) hypertension: Secondary | ICD-10-CM | POA: Diagnosis not present

## 2018-03-17 ENCOUNTER — Encounter: Payer: Self-pay | Admitting: Cardiovascular Disease

## 2018-03-17 ENCOUNTER — Telehealth: Payer: Self-pay | Admitting: Cardiovascular Disease

## 2018-03-17 ENCOUNTER — Encounter: Payer: Self-pay | Admitting: *Deleted

## 2018-03-17 ENCOUNTER — Ambulatory Visit (INDEPENDENT_AMBULATORY_CARE_PROVIDER_SITE_OTHER): Payer: Medicare Other | Admitting: Cardiovascular Disease

## 2018-03-17 VITALS — BP 118/74 | HR 66 | Ht 65.0 in | Wt 228.2 lb

## 2018-03-17 DIAGNOSIS — Z9989 Dependence on other enabling machines and devices: Secondary | ICD-10-CM

## 2018-03-17 DIAGNOSIS — G4733 Obstructive sleep apnea (adult) (pediatric): Secondary | ICD-10-CM

## 2018-03-17 DIAGNOSIS — I25118 Atherosclerotic heart disease of native coronary artery with other forms of angina pectoris: Secondary | ICD-10-CM

## 2018-03-17 DIAGNOSIS — I10 Essential (primary) hypertension: Secondary | ICD-10-CM | POA: Diagnosis not present

## 2018-03-17 DIAGNOSIS — Z8673 Personal history of transient ischemic attack (TIA), and cerebral infarction without residual deficits: Secondary | ICD-10-CM

## 2018-03-17 DIAGNOSIS — Z955 Presence of coronary angioplasty implant and graft: Secondary | ICD-10-CM

## 2018-03-17 DIAGNOSIS — R079 Chest pain, unspecified: Secondary | ICD-10-CM

## 2018-03-17 DIAGNOSIS — E785 Hyperlipidemia, unspecified: Secondary | ICD-10-CM | POA: Diagnosis not present

## 2018-03-17 MED ORDER — ISOSORBIDE MONONITRATE ER 60 MG PO TB24: 60.0000 mg | ORAL_TABLET | Freq: Every day | ORAL | 6 refills | Status: DC

## 2018-03-17 NOTE — Telephone Encounter (Signed)
Pre-cert Verification for the following procedure   lexiscan myoview scheduled for 03-23-2018 at [facility]

## 2018-03-17 NOTE — Patient Instructions (Addendum)
Medication Instructions:   Increase Imdur to 60mg  daily.   Continue all other medications.    Labwork: none  Testing/Procedures:  Your physician has requested that you have a lexiscan myoview. For further information please visit [email or web address]. Please follow instruction sheet, as given.  Office will contact with results via phone or letter.    Follow-Up: 6 weeks   Any Other Special Instructions Will Be Listed Below (If Applicable).  If you need a refill on your cardiac medications before your next appointment, please call your pharmacy.

## 2018-03-17 NOTE — Progress Notes (Signed)
SUBJECTIVE: The patient presents for follow-up of coronary artery disease.  She also has a history of sleep apnea, hypertension, hyperlipidemia, TIA, obesity, and asthma.  Her last coronary angiogram was in September 2014 and demonstrated a patent stent in the diagonal and otherwise nonobstructive disease.  She underwent drug-eluting stent placement to the diagonal in February 2009.  I reviewed labs dated 12/31/2017: Hemoglobin 14.5, low vitamin D at 26.3, TSH 1.61, BUN 10, creatinine 0.87, total cholesterol 142, triglycerides 165, HDL 60, LDL 45.  I personally reviewed an ECG performed on 03/11/2018 which demonstrated normal sinus rhythm with no ischemic ST segment or T wave abnormalities, nor any arrhythmias.  She has been experiencing chest pain since last week.  It can occur both with and without exertion.  There is associated shortness of breath.  She has occasional lightheadedness and dizziness but denies syncope.  She recently moved as did her daughter and there has been a lot of stress regarding this.    Review of Systems: As per "subjective", otherwise negative.  Allergies  Allergen Reactions  . Codeine Phosphate     REACTION: nausea  . Morphine Sulfate     REACTION: nausea      Past Medical History:  Diagnosis Date  . CAD (coronary artery disease)    a. DES to [place] 06/2007. b. Cath 01/2013 due to abnormal nuc at [facility]: nonobst dz, patent stent, EF 55-65%.  . Iron deficiency anemia, unspecified   . Irritable bowel syndrome   . Morbid obesity ([place])   . Other and unspecified hyperlipidemia   . Unspecified asthma(493.90)   . Unspecified essential hypertension     Past Surgical History:  Procedure Laterality Date  . CARPAL TUNNEL RELEASE     Bilateral  . CATARACT EXTRACTION W/PHACO Left 07/14/2016   Procedure: CATARACT EXTRACTION PHACO AND INTRAOCULAR LENS PLACEMENT (IOC);  Surgeon: Tonny Branch, MD;  Location: [facility];  Service: Ophthalmology;  Laterality:  Left;  CDE: 7.04  . CATARACT EXTRACTION W/PHACO Right 08/11/2016   Procedure: CATARACT EXTRACTION PHACO AND INTRAOCULAR LENS PLACEMENT (IOC);  Surgeon: Tonny Branch, MD;  Location: [facility];  Service: Ophthalmology;  Laterality: Right;  CDE: 11.58  . CHOLECYSTECTOMY OPEN  1987  . DILATION AND CURETTAGE OF UTERUS    . GASTRIC BYPASS SURGERY  1993   Subtotal gastrectomy  . INCISIONAL HERNIA REPAIR  01/25/2003   with mesh  . LEFT HEART CATHETERIZATION WITH CORONARY ANGIOGRAM N/A 02/15/2013   Procedure: LEFT HEART CATHETERIZATION WITH CORONARY ANGIOGRAM;  Surgeon: Peter M Martinique, MD;  Location: [facility] CATH LAB;  Service: Cardiovascular;  Laterality: N/A;  . TONSILLECTOMY     as a child    Social History   Socioeconomic History  . Marital status: Divorced    Spouse name: Not on file  . Number of children: 2  . Years of education: Not on file  . Highest education level: Not on file  Occupational History  . Occupation: RETIRED  Social Needs  . Financial resource strain: Not on file  . Food insecurity:    Worry: Not on file    Inability: Not on file  . Transportation needs:    Medical: Not on file    Non-medical: Not on file  Tobacco Use  . Smoking status: Former Smoker    Packs/day: 2.00    Years: 18.00    Pack years: 36.00    Types: Cigarettes    Start date: 05/20/1947    Last attempt to quit:  05/19/1965    Years since quitting: 52.8  . Smokeless tobacco: Former Systems developer    Types: Snuff, Chew    Quit date: 05/19/1946  . Tobacco comment: Started smoking at age 70-has not smoked in 77 years/dipped snuff and chewed tobacco during childhood  Substance and Sexual Activity  . Alcohol use: No    Alcohol/week: 0.0 standard drinks  . Drug use: No  . Sexual activity: Not on file  Lifestyle  . Physical activity:    Days per week: Not on file    Minutes per session: Not on file  . Stress: Not on file  Relationships  . Social connections:    Talks on phone: Not on file    Gets together: Not on file      Attends religious service: Not on file    Active member of club or organization: Not on file    Attends meetings of clubs or organizations: Not on file    Relationship status: Not on file  . Intimate partner violence:    Fear of current or ex partner: Not on file    Emotionally abused: Not on file    Physically abused: Not on file    Forced sexual activity: Not on file  Other Topics Concern  . Not on file  Social History Narrative  . Not on file     Vitals:   03/17/18 1505  BP: 118/74  Pulse: 66  SpO2: 97%  Weight: 228 lb 3.2 oz (103.5 kg)  Height: 5\' 5"  (1.651 m)    Wt Readings from Last 3 Encounters:  03/17/18 228 lb 3.2 oz (103.5 kg)  08/03/17 218 lb (98.9 kg)  08/13/16 223 lb (101.2 kg)     PHYSICAL EXAM General: NAD HEENT: Normal. Neck: No JVD, no thyromegaly. Lungs: Clear to auscultation bilaterally with normal respiratory effort. CV: Regular rate and rhythm, normal S1/S2, no S3/S4, no murmur. No pretibial or periankle edema.  No carotid bruit.   Abdomen: Soft, nontender, no distention.  Neurologic: Alert and oriented.  Psych: Normal affect. Skin: Normal. Musculoskeletal: No gross deformities.    ECG: Reviewed above under Subjective   Labs: Lab Results  Component Value Date/Time   K 4.6 07/09/2016 10:11 AM   BUN 10 07/09/2016 10:11 AM   CREATININE 0.96 07/09/2016 10:11 AM   ALT 13 07/07/2007 08:45 AM   HGB 12.8 07/09/2016 10:11 AM     Lipids: Lab Results  Component Value Date/Time   LDLCALC 31 02/15/2013 05:00 AM   CHOL 125 02/15/2013 05:00 AM   TRIG 227 (H) 02/15/2013 05:00 AM   HDL 49 02/15/2013 05:00 AM       ASSESSMENT AND PLAN:  1.  Coronary artery disease: She has been experience in chest pain associated with shortness of breath for the past week.  She is on Plavix for a history of CVA.    I will increase Imdur to 60 mg.  I will arrange for a Lexiscan Myoview to assess for significant ischemic territories.  Continue metoprolol  succinate, Ranexa, and simvastatin.  2.  Chronic hypertension: Blood pressure is controlled.    I will monitor given increase of Imdur.  3.  Hyperlipidemia: Continue simvastatin 40 mg.  I will obtain a copy of lipids from PCP.  4.  History of CVA: On Plavix and statin.  5.  Obstructive sleep apnea: On CPAP.    Disposition: Follow up 6 weeks   Kate Sable, M.D., F.A.C.C.

## 2018-03-23 ENCOUNTER — Encounter (HOSPITAL_COMMUNITY): Admission: RE | Admit: 2018-03-23 | Payer: Medicare Other | Source: Ambulatory Visit

## 2018-03-23 ENCOUNTER — Encounter (HOSPITAL_COMMUNITY): Payer: Self-pay

## 2018-03-23 ENCOUNTER — Encounter (HOSPITAL_COMMUNITY)
Admission: RE | Admit: 2018-03-23 | Discharge: 2018-03-23 | Disposition: A | Discharge status: Home / Self Care | Payer: Medicare Other | Source: Ambulatory Visit | Attending: Cardiovascular Disease | Admitting: Cardiovascular Disease

## 2018-03-23 DIAGNOSIS — R079 Chest pain, unspecified: Secondary | ICD-10-CM | POA: Insufficient documentation

## 2018-04-02 ENCOUNTER — Encounter (HOSPITAL_COMMUNITY)
Admission: RE | Admit: 2018-04-02 | Discharge: 2018-04-02 | Disposition: A | Discharge status: Home / Self Care | Payer: Medicare Other | Source: Ambulatory Visit | Attending: Cardiovascular Disease | Admitting: Cardiovascular Disease

## 2018-04-02 ENCOUNTER — Encounter (HOSPITAL_COMMUNITY): Payer: Self-pay

## 2018-04-02 DIAGNOSIS — R079 Chest pain, unspecified: Secondary | ICD-10-CM | POA: Insufficient documentation

## 2018-04-02 HISTORY — DX: Unspecified asthma, uncomplicated: J45.909

## 2018-04-02 LAB — NM MYOCAR MULTI W/SPECT W/WALL MOTION / EF
CHL CUP NUCLEAR SDS: 2
CHL CUP RESTING HR STRESS: 71 {beats}/min
CSEPPHR: 84 {beats}/min
LV dias vol: 42 mL (ref 46–106)
LVSYSVOL: 9 mL
NUC STRESS TID: 0.84
RATE: 0.58
SRS: 0
SSS: 2

## 2018-04-02 MED ORDER — TECHNETIUM TC 99M TETROFOSMIN IV KIT
10.0000 | PACK | Freq: Once | INTRAVENOUS | Status: AC | PRN
  Administered 2018-04-02: 9.6 via INTRAVENOUS

## 2018-04-02 MED ORDER — SODIUM CHLORIDE 0.9% FLUSH
INTRAVENOUS | Status: AC
  Administered 2018-04-02: 10 mL via INTRAVENOUS
  Filled 2018-04-02: qty 10

## 2018-04-02 MED ORDER — REGADENOSON 0.4 MG/5ML IV SOLN
INTRAVENOUS | Status: AC
  Administered 2018-04-02: 0.4 mg via INTRAVENOUS
  Filled 2018-04-02: qty 5

## 2018-04-02 MED ORDER — TECHNETIUM TC 99M TETROFOSMIN IV KIT
30.0000 | PACK | Freq: Once | INTRAVENOUS | Status: AC | PRN
  Administered 2018-04-02: 30 via INTRAVENOUS

## 2018-04-05 ENCOUNTER — Telehealth: Payer: Self-pay | Admitting: *Deleted

## 2018-04-05 NOTE — Telephone Encounter (Signed)
-----   Message from Merlene Laughter, RN sent at 04/02/2018  3:51 PM EST -----   ----- Message ----- From: Herminio Commons, MD Sent: 04/02/2018   3:39 PM EST To: Merlene Laughter, RN  Low risk for significant blockages.

## 2018-04-06 NOTE — Telephone Encounter (Signed)
Patient informedl

## 2018-04-30 ENCOUNTER — Ambulatory Visit: Payer: Medicare Other | Admitting: Cardiovascular Disease

## 2018-05-17 DIAGNOSIS — J4520 Mild intermittent asthma, uncomplicated: Secondary | ICD-10-CM | POA: Diagnosis not present

## 2018-05-17 DIAGNOSIS — D511 Vitamin B12 deficiency anemia due to selective vitamin B12 malabsorption with proteinuria: Secondary | ICD-10-CM | POA: Diagnosis not present

## 2018-05-17 DIAGNOSIS — E782 Mixed hyperlipidemia: Secondary | ICD-10-CM | POA: Diagnosis not present

## 2018-05-17 DIAGNOSIS — I10 Essential (primary) hypertension: Secondary | ICD-10-CM | POA: Diagnosis not present

## 2018-05-26 NOTE — Progress Notes (Signed)
Cardiology Office Note    Date:  05/31/2018   ID:  Tara, Love 1940-08-26, MRN [id number]  PCP:  Caryl Bis, MD  Cardiologist: Kate Sable, MD EPS: None  Chief Complaint  Patient presents with  . Follow-up    History of Present Illness:  Tara Love is a 78 y.o. female with history of CAD status post DES to the diagonal 06/2007, cath 2014 patent stent in Dx otherwise nonobstructive disease.  Also history of hypertension, HLD, TIA, sleep apnea, obesity and asthma.  Patient saw Dr. Bronson Ing 03/17/2018 at which time she was having chest pain and was under a lot of stress. Nuclear stress test 04/02/2018 showed no ST changes no arrhythmias and normal overall test EF 79%.  Patient comes in for regular f/u. Was sick with a cold over Christmas but is doing much better now. No further chest pain, shortness of breath, dizziness or presyncope. No regular exercise, falls a lot. Overall doing well.   Past Medical History:  Diagnosis Date  . Asthma   . CAD (coronary artery disease)    a. DES to [place] 06/2007. b. Cath 01/2013 due to abnormal nuc at [facility]: nonobst dz, patent stent, EF 55-65%.  . Iron deficiency anemia, unspecified   . Irritable bowel syndrome   . Morbid obesity ([place])   . Other and unspecified hyperlipidemia   . Unspecified asthma(493.90)   . Unspecified essential hypertension     Past Surgical History:  Procedure Laterality Date  . CARPAL TUNNEL RELEASE     Bilateral  . CATARACT EXTRACTION W/PHACO Left 07/14/2016   Procedure: CATARACT EXTRACTION PHACO AND INTRAOCULAR LENS PLACEMENT (IOC);  Surgeon: Tonny Branch, MD;  Location: [facility];  Service: Ophthalmology;  Laterality: Left;  CDE: 7.04  . CATARACT EXTRACTION W/PHACO Right 08/11/2016   Procedure: CATARACT EXTRACTION PHACO AND INTRAOCULAR LENS PLACEMENT (IOC);  Surgeon: Tonny Branch, MD;  Location: [facility];  Service: Ophthalmology;  Laterality: Right;  CDE: 11.58  . CHOLECYSTECTOMY OPEN  1987  .  DILATION AND CURETTAGE OF UTERUS    . GASTRIC BYPASS SURGERY  1993   Subtotal gastrectomy  . INCISIONAL HERNIA REPAIR  01/25/2003   with mesh  . LEFT HEART CATHETERIZATION WITH CORONARY ANGIOGRAM N/A 02/15/2013   Procedure: LEFT HEART CATHETERIZATION WITH CORONARY ANGIOGRAM;  Surgeon: Peter M Martinique, MD;  Location: [facility] CATH LAB;  Service: Cardiovascular;  Laterality: N/A;  . TONSILLECTOMY     as a child    Current Medications: Current Meds  Medication Sig  . acetaminophen (TYLENOL) 500 MG tablet Take 500 mg by mouth every 6 (six) hours as needed for pain. For pain in back and knee  . clopidogrel (PLAVIX) 75 MG tablet TAKE 1 TABLET BY MOUTH ONCE DAILY STOP ASPIRIN  . furosemide (LASIX) 20 MG tablet Take 20 mg by mouth daily as needed for fluid.   . isosorbide mononitrate (IMDUR) 60 MG 24 hr tablet Take 1 tablet (60 mg total) by mouth daily.  . metoprolol succinate (TOPROL-XL) 50 MG 24 hr tablet Take 1 tablet (50 mg total) by mouth every morning.  . nitroGLYCERIN (NITROSTAT) 0.4 MG SL tablet Place 1 tablet (0.4 mg total) under the tongue every 5 (five) minutes as needed for chest pain (up to 3 doses).  . ranolazine (RANEXA) 1000 MG SR tablet TAKE 1 TABLET BY MOUTH TWICE DAILY  . simvastatin (ZOCOR) 40 MG tablet Take 40 mg by mouth at bedtime.       Allergies:  Codeine phosphate and Morphine sulfate   Social History   Socioeconomic History  . Marital status: Divorced    Spouse name: Not on file  . Number of children: 2  . Years of education: Not on file  . Highest education level: Not on file  Occupational History  . Occupation: RETIRED  Social Needs  . Financial resource strain: Not on file  . Food insecurity:    Worry: Not on file    Inability: Not on file  . Transportation needs:    Medical: Not on file    Non-medical: Not on file  Tobacco Use  . Smoking status: Former Smoker    Packs/day: 2.00    Years: 18.00    Pack years: 36.00    Types: Cigarettes    Start date:  05/20/1947    Last attempt to quit: 05/19/1965    Years since quitting: 53.0  . Smokeless tobacco: Former Systems developer    Types: Snuff, Chew    Quit date: 05/19/1946  . Tobacco comment: Started smoking at age 29-has not smoked in 25 years/dipped snuff and chewed tobacco during childhood  Substance and Sexual Activity  . Alcohol use: No    Alcohol/week: 0.0 standard drinks  . Drug use: No  . Sexual activity: Not on file  Lifestyle  . Physical activity:    Days per week: Not on file    Minutes per session: Not on file  . Stress: Not on file  Relationships  . Social connections:    Talks on phone: Not on file    Gets together: Not on file    Attends religious service: Not on file    Active member of club or organization: Not on file    Attends meetings of clubs or organizations: Not on file    Relationship status: Not on file  Other Topics Concern  . Not on file  Social History Narrative  . Not on file     Family History:  The patient's family history includes Cancer in her father; Hernia (age of onset: 51) in her mother.   ROS:   Please see the history of present illness.    Review of Systems  Constitution: Negative.  HENT: Negative.   Eyes: Negative.   Cardiovascular: Negative.   Respiratory: Negative.   Hematologic/Lymphatic: Negative.   Musculoskeletal: Positive for arthritis, falls, joint pain, muscle weakness and stiffness.  Gastrointestinal: Negative.   Genitourinary: Negative.   Neurological: Negative.    All other systems reviewed and are negative.   PHYSICAL EXAM:   VS:  BP 126/72   Pulse 83   Ht 5\' 5"  (1.651 m)   Wt 224 lb (101.6 kg)   SpO2 96%   BMI 37.28 kg/m   Physical Exam  GEN: Obese, in no acute distress  Neck: no JVD, carotid bruits, or masses Cardiac:RRR; no murmurs, rubs, or gallops  Respiratory:  clear to auscultation bilaterally, normal work of breathing GI: soft, nontender, nondistended, + BS Ext: without cyanosis, clubbing, or edema, Good distal  pulses bilaterally Neuro:  Alert and Oriented x 3 Psych: euthymic mood, full affect  Wt Readings from Last 3 Encounters:  05/31/18 224 lb (101.6 kg)  03/17/18 228 lb 3.2 oz (103.5 kg)  08/03/17 218 lb (98.9 kg)      Studies/Labs Reviewed:   EKG:  EKG is not ordered today.   Recent Labs: No results found for requested labs within last 8760 hours.   Lipid Panel    Component Value Date/Time  CHOL 125 02/15/2013 0500   TRIG 227 (H) 02/15/2013 0500   HDL 49 02/15/2013 0500   CHOLHDL 2.6 02/15/2013 0500   VLDL 45 (H) 02/15/2013 0500   LDLCALC 31 02/15/2013 0500    Additional studies/ records that were reviewed today include:  NST 11/15/2019Overall Study Impression Myocardial perfusion is normal. The study is normal. This is a low risk study. Overall left ventricular systolic function was normal. LV cavity size is small. Nuclear stress EF: 79%.        ASSESSMENT:    1. CORONARY ARTERY DISEASE, S/P PTCA   2. Essential hypertension   3. History of CVA (cerebrovascular accident)   4. Mixed hyperlipidemia   5. OSA on CPAP      PLAN:  In order of problems listed above:  CAD status post DES to the diagonal 06/2007, cath 2014 patent stent in the diagonal otherwise nonobstructive disease.  Recent chest pain and dyspnea on exertion while under stress with Lexiscan Myoview 04/02/2018 was normal.  Imdur added by Dr. Bronson Ing.  No further angina.  Recommend 20 to 30 minutes of exercise daily.  Essential hypertension blood pressure well controlled  Hyperlipidemia on Zocor and followed by Dr. Olena Heckle  History of CVA on Plavix  OSA on CPAP  Medication Adjustments/Labs and Tests Ordered: Current medicines are reviewed at length with the patient today.  Concerns regarding medicines are outlined above.  Medication changes, Labs and Tests ordered today are listed in the Patient Instructions below. Patient Instructions  Medication Instructions:  Your physician recommends that  you continue on your current medications as directed. Please refer to the Current Medication list given to you today.  If you need a refill on your cardiac medications before your next appointment, please call your pharmacy.   Lab work: NONE   If you have labs (blood work) drawn today and your tests are completely normal, you will receive your results only by: Marland Kitchen MyChart Message (if you have MyChart) OR . A paper copy in the mail If you have any lab test that is abnormal or we need to change your treatment, we will call you to review the results.  Testing/Procedures: NONE   Follow-Up: At [facility], you and your health needs are our priority.  As part of our continuing mission to provide you with exceptional heart care, we have created designated Provider Care Teams.  These Care Teams include your primary Cardiologist (physician) and Advanced Practice Providers (APPs -  Physician Assistants and Nurse Practitioners) who all work together to provide you with the care you need, when you need it. You will need a follow up appointment in 6 months.  Please call our office 2 months in advance to schedule this appointment.  You may see Kate Sable, MD or one of the following Advanced Practice Providers on your designated Care Team:   Bernerd Pho, PA-C [facility]) . Ermalinda Barrios, PA-C ([place])  Any Other Special Instructions Will Be Listed Below (If Applicable). Your physician discussed the importance of regular exercise and recommended that you start or continue a regular exercise program for good health. Please exercise 20-30 minutes daily.   Thank you for choosing [facility]!        Signed, Ermalinda Barrios, PA-C  05/31/2018 [facility] [place], [place], [place]  [address] Phone: [phone]; Fax: [phone]

## 2018-05-31 ENCOUNTER — Encounter: Payer: Self-pay | Admitting: Physician Assistant

## 2018-05-31 ENCOUNTER — Ambulatory Visit (INDEPENDENT_AMBULATORY_CARE_PROVIDER_SITE_OTHER): Payer: Medicare Other | Admitting: Physician Assistant

## 2018-05-31 VITALS — BP 126/72 | HR 83 | Ht 65.0 in | Wt 224.0 lb

## 2018-05-31 DIAGNOSIS — I259 Chronic ischemic heart disease, unspecified: Secondary | ICD-10-CM

## 2018-05-31 DIAGNOSIS — G4733 Obstructive sleep apnea (adult) (pediatric): Secondary | ICD-10-CM | POA: Diagnosis not present

## 2018-05-31 DIAGNOSIS — I10 Essential (primary) hypertension: Secondary | ICD-10-CM | POA: Diagnosis not present

## 2018-05-31 DIAGNOSIS — Z8673 Personal history of transient ischemic attack (TIA), and cerebral infarction without residual deficits: Secondary | ICD-10-CM

## 2018-05-31 DIAGNOSIS — E782 Mixed hyperlipidemia: Secondary | ICD-10-CM

## 2018-05-31 DIAGNOSIS — Z9989 Dependence on other enabling machines and devices: Secondary | ICD-10-CM

## 2018-05-31 NOTE — Patient Instructions (Addendum)
Medication Instructions:  Your physician recommends that you continue on your current medications as directed. Please refer to the Current Medication list given to you today.  If you need a refill on your cardiac medications before your next appointment, please call your pharmacy.   Lab work: NONE   If you have labs (blood work) drawn today and your tests are completely normal, you will receive your results only by: Marland Kitchen MyChart Message (if you have MyChart) OR . A paper copy in the mail If you have any lab test that is abnormal or we need to change your treatment, we will call you to review the results.  Testing/Procedures: NONE   Follow-Up: At [facility], you and your health needs are our priority.  As part of our continuing mission to provide you with exceptional heart care, we have created designated Provider Care Teams.  These Care Teams include your primary Cardiologist (physician) and Advanced Practice Providers (APPs -  Physician Assistants and Nurse Practitioners) who all work together to provide you with the care you need, when you need it. You will need a follow up appointment in 6 months.  Please call our office 2 months in advance to schedule this appointment.  You may see Kate Sable, MD or one of the following Advanced Practice Providers on your designated Care Team:   Bernerd Pho, PA-C [facility]) . Ermalinda Barrios, PA-C ([place])  Any Other Special Instructions Will Be Listed Below (If Applicable). Your physician discussed the importance of regular exercise and recommended that you start or continue a regular exercise program for good health. Please exercise 20-30 minutes daily.   Thank you for choosing [facility]!

## 2018-07-12 DIAGNOSIS — N183 Chronic kidney disease, stage 3 (moderate): Secondary | ICD-10-CM | POA: Diagnosis not present

## 2018-07-12 DIAGNOSIS — E162 Hypoglycemia, unspecified: Secondary | ICD-10-CM | POA: Diagnosis not present

## 2018-07-12 DIAGNOSIS — E039 Hypothyroidism, unspecified: Secondary | ICD-10-CM | POA: Diagnosis not present

## 2018-07-12 DIAGNOSIS — R0902 Hypoxemia: Secondary | ICD-10-CM | POA: Diagnosis not present

## 2018-07-12 DIAGNOSIS — E782 Mixed hyperlipidemia: Secondary | ICD-10-CM | POA: Diagnosis not present

## 2018-07-12 DIAGNOSIS — I10 Essential (primary) hypertension: Secondary | ICD-10-CM | POA: Diagnosis not present

## 2018-07-13 DIAGNOSIS — E782 Mixed hyperlipidemia: Secondary | ICD-10-CM | POA: Diagnosis not present

## 2018-07-13 DIAGNOSIS — E559 Vitamin D deficiency, unspecified: Secondary | ICD-10-CM | POA: Diagnosis not present

## 2018-07-13 DIAGNOSIS — D511 Vitamin B12 deficiency anemia due to selective vitamin B12 malabsorption with proteinuria: Secondary | ICD-10-CM | POA: Diagnosis not present

## 2018-07-13 DIAGNOSIS — R5383 Other fatigue: Secondary | ICD-10-CM | POA: Diagnosis not present

## 2018-07-13 DIAGNOSIS — I10 Essential (primary) hypertension: Secondary | ICD-10-CM | POA: Diagnosis not present

## 2018-07-22 DIAGNOSIS — M1811 Unilateral primary osteoarthritis of first carpometacarpal joint, right hand: Secondary | ICD-10-CM | POA: Diagnosis not present

## 2018-07-22 DIAGNOSIS — M65331 Trigger finger, right middle finger: Secondary | ICD-10-CM | POA: Diagnosis not present

## 2018-09-14 DIAGNOSIS — R51 Headache: Secondary | ICD-10-CM | POA: Diagnosis not present

## 2018-09-14 DIAGNOSIS — S060X9A Concussion with loss of consciousness of unspecified duration, initial encounter: Secondary | ICD-10-CM | POA: Diagnosis not present

## 2018-09-14 DIAGNOSIS — B354 Tinea corporis: Secondary | ICD-10-CM | POA: Diagnosis not present

## 2018-09-16 DIAGNOSIS — I25119 Atherosclerotic heart disease of native coronary artery with unspecified angina pectoris: Secondary | ICD-10-CM | POA: Diagnosis not present

## 2018-09-16 DIAGNOSIS — E782 Mixed hyperlipidemia: Secondary | ICD-10-CM | POA: Diagnosis not present

## 2018-09-16 DIAGNOSIS — I10 Essential (primary) hypertension: Secondary | ICD-10-CM | POA: Diagnosis not present

## 2018-11-25 DIAGNOSIS — H26493 Other secondary cataract, bilateral: Secondary | ICD-10-CM | POA: Diagnosis not present

## 2018-11-25 DIAGNOSIS — H43812 Vitreous degeneration, left eye: Secondary | ICD-10-CM | POA: Diagnosis not present

## 2018-12-15 ENCOUNTER — Ambulatory Visit: Payer: Medicare Other | Admitting: Cardiovascular Disease

## 2018-12-17 DIAGNOSIS — E785 Hyperlipidemia, unspecified: Secondary | ICD-10-CM | POA: Diagnosis not present

## 2018-12-17 DIAGNOSIS — I10 Essential (primary) hypertension: Secondary | ICD-10-CM | POA: Diagnosis not present

## 2018-12-17 DIAGNOSIS — J4541 Moderate persistent asthma with (acute) exacerbation: Secondary | ICD-10-CM | POA: Diagnosis not present

## 2018-12-28 ENCOUNTER — Ambulatory Visit (INDEPENDENT_AMBULATORY_CARE_PROVIDER_SITE_OTHER): Payer: Medicare Other | Admitting: Cardiology

## 2018-12-28 ENCOUNTER — Other Ambulatory Visit: Payer: Self-pay

## 2018-12-28 ENCOUNTER — Encounter: Payer: Self-pay | Admitting: Cardiology

## 2018-12-28 VITALS — BP 128/72 | HR 74 | Temp 98.9°F | Ht 65.5 in | Wt 237.0 lb

## 2018-12-28 DIAGNOSIS — E6609 Other obesity due to excess calories: Secondary | ICD-10-CM

## 2018-12-28 DIAGNOSIS — G4733 Obstructive sleep apnea (adult) (pediatric): Secondary | ICD-10-CM | POA: Diagnosis not present

## 2018-12-28 DIAGNOSIS — Z8673 Personal history of transient ischemic attack (TIA), and cerebral infarction without residual deficits: Secondary | ICD-10-CM | POA: Diagnosis not present

## 2018-12-28 DIAGNOSIS — Z9989 Dependence on other enabling machines and devices: Secondary | ICD-10-CM

## 2018-12-28 DIAGNOSIS — I10 Essential (primary) hypertension: Secondary | ICD-10-CM | POA: Diagnosis not present

## 2018-12-28 DIAGNOSIS — Z6838 Body mass index (BMI) 38.0-38.9, adult: Secondary | ICD-10-CM

## 2018-12-28 DIAGNOSIS — I2510 Atherosclerotic heart disease of native coronary artery without angina pectoris: Secondary | ICD-10-CM | POA: Diagnosis not present

## 2018-12-28 DIAGNOSIS — E785 Hyperlipidemia, unspecified: Secondary | ICD-10-CM

## 2018-12-28 MED ORDER — CLOPIDOGREL BISULFATE 75 MG PO TABS: 75.0000 mg | ORAL_TABLET | Freq: Every day | ORAL | Status: DC

## 2018-12-28 NOTE — Patient Instructions (Addendum)
Medication Instructions:  Continue all current medications.  Labwork: none  Testing/Procedures: none  Follow-Up: Your physician wants you to follow up in: 6 months.  You will receive a reminder letter in the mail one-two months in advance.  If you don't receive a letter, please call our office to schedule the follow up appointment   Any Other Special Instructions Will Be Listed Below (If Applicable).  If you need a refill on your cardiac medications before your next appointment, please call your pharmacy.  _______________________________________________  Lifestyle Modifications to Prevent and Treat Heart Disease -Recommend heart healthy/Mediterranean diet, with whole grains, fruits, vegetables, fish, lean meats, nuts, olive oil and avocado oil.  -Limit salt intake to less than 1500 mg per day.  -Recommend moderate walking, starting slowly with a few minutes and working up to 3-5 times/week for 30-50 minutes each session. Aim for at least 150 minutes.week. Goal should be pace of 3 miles/hours, or walking 1.5 miles in 30 minutes -Recommend avoidance of tobacco products. Avoid excess alcohol. -Keep blood pressure well controlled, ideally less than 130/80.     Mediterranean Diet A Mediterranean diet refers to food and lifestyle choices that are based on the traditions of countries located on the [employer]. This way of eating has been shown to help prevent certain conditions and improve outcomes for people who have chronic diseases, like kidney disease and heart disease. What are tips for following this plan? Lifestyle  Cook and eat meals together with your family, when possible.  Drink enough fluid to keep your urine clear or pale yellow.  Be physically active every day. This includes: ? Aerobic exercise like running or swimming. ? Leisure activities like gardening, walking, or housework.  Get 7-8 hours of sleep each night.  If recommended by your health care provider,  drink red wine in moderation. This means 1 glass a day for nonpregnant women and 2 glasses a day for men. A glass of wine equals 5 oz (150 mL). Reading food labels   Check the serving size of packaged foods. For foods such as rice and pasta, the serving size refers to the amount of cooked product, not dry.  Check the total fat in packaged foods. Avoid foods that have saturated fat or trans fats.  Check the ingredients list for added sugars, such as corn syrup. Shopping  At the grocery store, buy most of your food from the areas near the walls of the store. This includes: ? Fresh fruits and vegetables (produce). ? Grains, beans, nuts, and seeds. Some of these may be available in unpackaged forms or large amounts (in bulk). ? Fresh seafood. ? Poultry and eggs. ? Low-fat dairy products.  Buy whole ingredients instead of prepackaged foods.  Buy fresh fruits and vegetables in-season from local farmers markets.  Buy frozen fruits and vegetables in resealable bags.  If you do not have access to quality fresh seafood, buy precooked frozen shrimp or canned fish, such as tuna, salmon, or sardines.  Buy small amounts of raw or cooked vegetables, salads, or olives from the deli or salad bar at your store.  Stock your pantry so you always have certain foods on hand, such as olive oil, canned tuna, canned tomatoes, rice, pasta, and beans. Cooking  Cook foods with extra-virgin olive oil instead of using butter or other vegetable oils.  Have meat as a side dish, and have vegetables or grains as your main dish. This means having meat in small portions or adding small amounts of  meat to foods like pasta or stew.  Use beans or vegetables instead of meat in common dishes like chili or lasagna.  Experiment with different cooking methods. Try roasting or broiling vegetables instead of steaming or sauteing them.  Add frozen vegetables to soups, stews, pasta, or rice.  Add nuts or seeds for added  healthy fat at each meal. You can add these to yogurt, salads, or vegetable dishes.  Marinate fish or vegetables using olive oil, lemon juice, garlic, and fresh herbs. Meal planning   Plan to eat 1 vegetarian meal one day each week. Try to work up to 2 vegetarian meals, if possible.  Eat seafood 2 or more times a week.  Have healthy snacks readily available, such as: ? Vegetable sticks with hummus. ? [place] yogurt. ? Fruit and nut trail mix.  Eat balanced meals throughout the week. This includes: ? Fruit: 2-3 servings a day ? Vegetables: 4-5 servings a day ? Low-fat dairy: 2 servings a day ? Fish, poultry, or lean meat: 1 serving a day ? Beans and legumes: 2 or more servings a week ? Nuts and seeds: 1-2 servings a day ? Whole grains: 6-8 servings a day ? Extra-virgin olive oil: 3-4 servings a day  Limit red meat and sweets to only a few servings a month What are my food choices?  Mediterranean diet ? Recommended  Grains: Whole-grain pasta. Brown rice. Bulgar wheat. Polenta. Couscous. Whole-wheat bread. [place].  Vegetables: Artichokes. Beets. Broccoli. Cabbage. Carrots. Eggplant. Green beans. Chard. Kale. Spinach. Onions. Leeks. Peas. Squash. Tomatoes. Peppers. Radishes.  Fruits: Apples. Apricots. Avocado. Berries. Bananas. Cherries. Dates. Figs. Grapes. Lemons. Melon. Oranges. Peaches. Plums. Pomegranate.  Meats and other protein foods: Beans. Almonds. Sunflower seeds. Pine nuts. Peanuts. [place]. Salmon. Scallops. Shrimp. [place]. Tilapia. Clams. Oysters. Eggs.  Dairy: Low-fat milk. Cheese. Greek yogurt.  Beverages: Water. Red wine. Herbal tea.  Fats and oils: Extra virgin olive oil. Avocado oil. Grape seed oil.  Sweets and desserts: [place] yogurt with honey. Baked apples. Poached pears. Trail mix.  Seasoning and other foods: Basil. Cilantro. Coriander. Cumin. Mint. Parsley. Sage. Rosemary. Tarragon. Garlic. Oregano. Thyme. Pepper. Balsalmic vinegar. Tahini. Hummus.  Tomato sauce. Olives. Mushrooms. ? Limit these  Grains: Prepackaged pasta or rice dishes. Prepackaged cereal with added sugar.  Vegetables: Deep fried potatoes (french fries).  Fruits: Fruit canned in syrup.  Meats and other protein foods: Beef. Pork. Lamb. Poultry with skin. Hot dogs. Berniece Salines.  Dairy: Ice cream. Sour cream. Whole milk.  Beverages: Juice. Sugar-sweetened soft drinks. Beer. Liquor and spirits.  Fats and oils: Butter. Canola oil. Vegetable oil. Beef fat (tallow). Lard.  Sweets and desserts: Cookies. Cakes. Pies. Candy.  Seasoning and other foods: Mayonnaise. Premade sauces and marinades. The items listed may not be a complete list. Talk with your dietitian about what dietary choices are right for you. Summary  The Mediterranean diet includes both food and lifestyle choices.  Eat a variety of fresh fruits and vegetables, beans, nuts, seeds, and whole grains.  Limit the amount of red meat and sweets that you eat.  Talk with your health care provider about whether it is safe for you to drink red wine in moderation. This means 1 glass a day for nonpregnant women and 2 glasses a day for men. A glass of wine equals 5 oz (150 mL). This information is not intended to replace advice given to you by your health care provider. Make sure you discuss any questions you have with your health care  provider. Document Released: 12/27/2015 Document Revised: 01/03/2016 Document Reviewed: 12/27/2015 Elsevier Patient Education  2020 [employer].

## 2018-12-28 NOTE — Progress Notes (Signed)
Cardiology Office Note:    Date:  12/28/2018   ID:  Tara Love, DOB 11-04-1940, MRN [id number]  PCP:  Caryl Bis, MD  Cardiologist:  Kate Sable, MD  Referring MD: Caryl Bis, MD   Chief Complaint  Patient presents with  . Follow-up  . Coronary Artery Disease    History of Present Illness:    Tara Love is a 78 y.o. female with a past medical history significant for CAD s/p DES to the diagonal 06/2007, cath 2014 patent stent in diagonal otherwise nonobstructive disease.  She also has a history of hypertension, hyperlipidemia, TIA, sleep apnea, obesity and asthma. Nuclear stress test 04/02/2018 showed no ST changes no arrhythmias and normal overall test EF 79%.  The patient was last seen in the office on 05/31/2018 by Estella Husk, PA at which time she was doing well.  Tara Love is here today for 25-month follow-up. She lives alone and is independent. Her daughter lives down the street. She walks out to mailbox, but not much else. She denies any chest pain/pressure, shortness of breath, orthopnea, PND, edema, lightheadedness, syncope.   She has been having bad headaches. She plans to see Dr. Quillian Quince for evaluation.   Past Medical History:  Diagnosis Date  . Asthma   . CAD (coronary artery disease)    a. DES to [place] 06/2007. b. Cath 01/2013 due to abnormal nuc at [facility]: nonobst dz, patent stent, EF 55-65%.  . Iron deficiency anemia, unspecified   . Irritable bowel syndrome   . Morbid obesity ([place])   . Other and unspecified hyperlipidemia   . Unspecified asthma(493.90)   . Unspecified essential hypertension     Past Surgical History:  Procedure Laterality Date  . CARPAL TUNNEL RELEASE     Bilateral  . CATARACT EXTRACTION W/PHACO Left 07/14/2016   Procedure: CATARACT EXTRACTION PHACO AND INTRAOCULAR LENS PLACEMENT (IOC);  Surgeon: Tonny Branch, MD;  Location: [facility];  Service: Ophthalmology;  Laterality: Left;  CDE: 7.04  . CATARACT EXTRACTION W/PHACO  Right 08/11/2016   Procedure: CATARACT EXTRACTION PHACO AND INTRAOCULAR LENS PLACEMENT (IOC);  Surgeon: Tonny Branch, MD;  Location: [facility];  Service: Ophthalmology;  Laterality: Right;  CDE: 11.58  . CHOLECYSTECTOMY OPEN  1987  . DILATION AND CURETTAGE OF UTERUS    . GASTRIC BYPASS SURGERY  1993   Subtotal gastrectomy  . INCISIONAL HERNIA REPAIR  01/25/2003   with mesh  . LEFT HEART CATHETERIZATION WITH CORONARY ANGIOGRAM N/A 02/15/2013   Procedure: LEFT HEART CATHETERIZATION WITH CORONARY ANGIOGRAM;  Surgeon: Peter M Martinique, MD;  Location: [facility] CATH LAB;  Service: Cardiovascular;  Laterality: N/A;  . TONSILLECTOMY     as a child    Current Medications: Current Meds  Medication Sig  . acetaminophen (TYLENOL) 500 MG tablet Take 500 mg by mouth every 6 (six) hours as needed for pain. For pain in back and knee  . clopidogrel (PLAVIX) 75 MG tablet Take 1 tablet (75 mg total) by mouth daily.  . furosemide (LASIX) 20 MG tablet Take 20 mg by mouth daily as needed for fluid.   . isosorbide mononitrate (IMDUR) 60 MG 24 hr tablet Take 1 tablet (60 mg total) by mouth daily.  . metoprolol succinate (TOPROL-XL) 50 MG 24 hr tablet Take 1 tablet (50 mg total) by mouth every morning.  . nitroGLYCERIN (NITROSTAT) 0.4 MG SL tablet Place 1 tablet (0.4 mg total) under the tongue every 5 (five) minutes as needed for chest pain (up to  3 doses).  . ranolazine (RANEXA) 1000 MG SR tablet TAKE 1 TABLET BY MOUTH TWICE DAILY  . simvastatin (ZOCOR) 40 MG tablet Take 40 mg by mouth at bedtime.    .       Allergies:   Codeine phosphate and Morphine sulfate   Social History   Socioeconomic History  . Marital status: Divorced    Spouse name: Not on file  . Number of children: 2  . Years of education: Not on file  . Highest education level: Not on file  Occupational History  . Occupation: RETIRED  Social Needs  . Financial resource strain: Not on file  . Food insecurity    Worry: Not on file    Inability: Not on  file  . Transportation needs    Medical: Not on file    Non-medical: Not on file  Tobacco Use  . Smoking status: Former Smoker    Packs/day: 2.00    Years: 18.00    Pack years: 36.00    Types: Cigarettes    Start date: 05/20/1947    Quit date: 05/19/1965    Years since quitting: 53.6  . Smokeless tobacco: Former Systems developer    Types: Snuff, Chew    Quit date: 05/19/1946  . Tobacco comment: Started smoking at age 32-has not smoked in 57 years/dipped snuff and chewed tobacco during childhood  Substance and Sexual Activity  . Alcohol use: No    Alcohol/week: 0.0 standard drinks  . Drug use: No  . Sexual activity: Not on file  Lifestyle  . Physical activity    Days per week: Not on file    Minutes per session: Not on file  . Stress: Not on file  Relationships  . Social Herbalist on phone: Not on file    Gets together: Not on file    Attends religious service: Not on file    Active member of club or organization: Not on file    Attends meetings of clubs or organizations: Not on file    Relationship status: Not on file  Other Topics Concern  . Not on file  Social History Narrative  . Not on file     Family History: The patient's family history includes Cancer in her father; Hernia (age of onset: 70) in her mother. ROS:   Please see the history of present illness.     All other systems reviewed and are negative.  EKGs/Labs/Other Studies Reviewed:    The following studies were reviewed today: As per HPI  EKG:  EKG is ordered today.  The ekg ordered today demonstrates NSR, 78 bpm, low voltage  Recent Labs: No results found for requested labs within last 8760 hours.   Recent Lipid Panel    Component Value Date/Time   CHOL 125 02/15/2013 0500   TRIG 227 (H) 02/15/2013 0500   HDL 49 02/15/2013 0500   CHOLHDL 2.6 02/15/2013 0500   VLDL 45 (H) 02/15/2013 0500   LDLCALC 31 02/15/2013 0500    Physical Exam:    VS:  BP 128/72   Pulse 74   Temp 98.9 F (37.2 C)    Ht 5' 5.5" (1.664 m)   Wt 237 lb (107.5 kg)   SpO2 98%   BMI 38.84 kg/m     Wt Readings from Last 3 Encounters:  12/28/18 237 lb (107.5 kg)  05/31/18 224 lb (101.6 kg)  03/17/18 228 lb 3.2 oz (103.5 kg)     Physical Exam  Constitutional:  She is oriented to person, place, and time. She appears well-developed and well-nourished. No distress.  HENT:  Head: Normocephalic and atraumatic.  Neck: Normal range of motion. Neck supple. No JVD present.  Cardiovascular: Normal rate, regular rhythm and intact distal pulses.  Murmur heard. High-pitched blowing holosystolic murmur is present with a grade of 1/6 at the upper left sternal border. Pulmonary/Chest: Effort normal and breath sounds normal. No respiratory distress. She has no wheezes. She has no rales.  Abdominal: Soft. Bowel sounds are normal.  Musculoskeletal: Normal range of motion.        General: No edema.  Neurological: She is alert and oriented to person, place, and time.  Skin: Skin is warm and dry.  Psychiatric: She has a normal mood and affect. Her behavior is normal. Judgment and thought content normal.    ASSESSMENT:    1. Coronary artery disease involving native coronary artery of native heart without angina pectoris   2. Essential (primary) hypertension   3. Hyperlipidemia, unspecified hyperlipidemia type   4. History of CVA (cerebrovascular accident)   5. OSA on CPAP   6. Class 2 obesity due to excess calories with body mass index (BMI) of 38.0 to 38.9 in adult, unspecified whether serious comorbidity present    PLAN:    In order of problems listed above:  CAD s/p DES to diagonal 06/2007, cath 2014 showed patent stent and otherwise nonobstructive disease.  Lexiscan Myoview for chest pain in 03/2018 was normal. On Ranexa.  Patient was started on Imdur with no further angina.  Essential hypertension, BP well controlled.  Hyperlipidemia on Zocor and followed by Dr. Quillian Quince  History of CVA on Plavix, pt denies  awareness of a prior stroke.   OSA: no longer using CPAP since she lost weight.   Obesity -Body mass index is 38.84 kg/m.  -Pt advised to try and improve diet for weight loss, mainly limiting carbs. She says that she eats the way she wants and is not going to change, not going to reduce bread or other carbs.    Medication Adjustments/Labs and Tests Ordered: Current medicines are reviewed at length with the patient today.  Concerns regarding medicines are outlined above. Labs and tests ordered and medication changes are outlined in the patient instructions below:  Patient Instructions  Medication Instructions:  Continue all current medications.  Labwork: none  Testing/Procedures: none  Follow-Up: Your physician wants you to follow up in: 6 months.  You will receive a reminder letter in the mail one-two months in advance.  If you don't receive a letter, please call our office to schedule the follow up appointment   Any Other Special Instructions Will Be Listed Below (If Applicable).  If you need a refill on your cardiac medications before your next appointment, please call your pharmacy.  _______________________________________________  Lifestyle Modifications to Prevent and Treat Heart Disease -Recommend heart healthy/Mediterranean diet, with whole grains, fruits, vegetables, fish, lean meats, nuts, olive oil and avocado oil.  -Limit salt intake to less than 1500 mg per day.  -Recommend moderate walking, starting slowly with a few minutes and working up to 3-5 times/week for 30-50 minutes each session. Aim for at least 150 minutes.week. Goal should be pace of 3 miles/hours, or walking 1.5 miles in 30 minutes -Recommend avoidance of tobacco products. Avoid excess alcohol. -Keep blood pressure well controlled, ideally less than 130/80.     Mediterranean Diet A Mediterranean diet refers to food and lifestyle choices that are based on the traditions  of countries located on the  [employer]. This way of eating has been shown to help prevent certain conditions and improve outcomes for people who have chronic diseases, like kidney disease and heart disease. What are tips for following this plan? Lifestyle  Cook and eat meals together with your family, when possible.  Drink enough fluid to keep your urine clear or pale yellow.  Be physically active every day. This includes: ? Aerobic exercise like running or swimming. ? Leisure activities like gardening, walking, or housework.  Get 7-8 hours of sleep each night.  If recommended by your health care provider, drink red wine in moderation. This means 1 glass a day for nonpregnant women and 2 glasses a day for men. A glass of wine equals 5 oz (150 mL). Reading food labels   Check the serving size of packaged foods. For foods such as rice and pasta, the serving size refers to the amount of cooked product, not dry.  Check the total fat in packaged foods. Avoid foods that have saturated fat or trans fats.  Check the ingredients list for added sugars, such as corn syrup. Shopping  At the grocery store, buy most of your food from the areas near the walls of the store. This includes: ? Fresh fruits and vegetables (produce). ? Grains, beans, nuts, and seeds. Some of these may be available in unpackaged forms or large amounts (in bulk). ? Fresh seafood. ? Poultry and eggs. ? Low-fat dairy products.  Buy whole ingredients instead of prepackaged foods.  Buy fresh fruits and vegetables in-season from local farmers markets.  Buy frozen fruits and vegetables in resealable bags.  If you do not have access to quality fresh seafood, buy precooked frozen shrimp or canned fish, such as tuna, salmon, or sardines.  Buy small amounts of raw or cooked vegetables, salads, or olives from the deli or salad bar at your store.  Stock your pantry so you always have certain foods on hand, such as olive oil, canned tuna, canned  tomatoes, rice, pasta, and beans. Cooking  Cook foods with extra-virgin olive oil instead of using butter or other vegetable oils.  Have meat as a side dish, and have vegetables or grains as your main dish. This means having meat in small portions or adding small amounts of meat to foods like pasta or stew.  Use beans or vegetables instead of meat in common dishes like chili or lasagna.  Experiment with different cooking methods. Try roasting or broiling vegetables instead of steaming or sauteing them.  Add frozen vegetables to soups, stews, pasta, or rice.  Add nuts or seeds for added healthy fat at each meal. You can add these to yogurt, salads, or vegetable dishes.  Marinate fish or vegetables using olive oil, lemon juice, garlic, and fresh herbs. Meal planning   Plan to eat 1 vegetarian meal one day each week. Try to work up to 2 vegetarian meals, if possible.  Eat seafood 2 or more times a week.  Have healthy snacks readily available, such as: ? Vegetable sticks with hummus. ? [place] yogurt. ? Fruit and nut trail mix.  Eat balanced meals throughout the week. This includes: ? Fruit: 2-3 servings a day ? Vegetables: 4-5 servings a day ? Low-fat dairy: 2 servings a day ? Fish, poultry, or lean meat: 1 serving a day ? Beans and legumes: 2 or more servings a week ? Nuts and seeds: 1-2 servings a day ? Whole grains: 6-8 servings a day ?  Extra-virgin olive oil: 3-4 servings a day  Limit red meat and sweets to only a few servings a month What are my food choices?  Mediterranean diet ? Recommended  Grains: Whole-grain pasta. Brown rice. Bulgar wheat. Polenta. Couscous. Whole-wheat bread. [place].  Vegetables: Artichokes. Beets. Broccoli. Cabbage. Carrots. Eggplant. Green beans. Chard. Kale. Spinach. Onions. Leeks. Peas. Squash. Tomatoes. Peppers. Radishes.  Fruits: Apples. Apricots. Avocado. Berries. Bananas. Cherries. Dates. Figs. Grapes. Lemons. Melon. Oranges.  Peaches. Plums. Pomegranate.  Meats and other protein foods: Beans. Almonds. Sunflower seeds. Pine nuts. Peanuts. [place]. Salmon. Scallops. Shrimp. [place]. Tilapia. Clams. Oysters. Eggs.  Dairy: Low-fat milk. Cheese. Greek yogurt.  Beverages: Water. Red wine. Herbal tea.  Fats and oils: Extra virgin olive oil. Avocado oil. Grape seed oil.  Sweets and desserts: [place] yogurt with honey. Baked apples. Poached pears. Trail mix.  Seasoning and other foods: Basil. Cilantro. Coriander. Cumin. Mint. Parsley. Sage. Rosemary. Tarragon. Garlic. Oregano. Thyme. Pepper. Balsalmic vinegar. Tahini. Hummus. Tomato sauce. Olives. Mushrooms. ? Limit these  Grains: Prepackaged pasta or rice dishes. Prepackaged cereal with added sugar.  Vegetables: Deep fried potatoes (french fries).  Fruits: Fruit canned in syrup.  Meats and other protein foods: Beef. Pork. Lamb. Poultry with skin. Hot dogs. [place] Salines.  Dairy: Ice cream. Sour cream. Whole milk.  Beverages: Juice. Sugar-sweetened soft drinks. Beer. Liquor and spirits.  Fats and oils: Butter. [place] oil. Vegetable oil. Beef fat (tallow). Lard.  Sweets and desserts: Cookies. Cakes. Pies. Candy.  Seasoning and other foods: Mayonnaise. Premade sauces and marinades. The items listed may not be a complete list. Talk with your dietitian about what dietary choices are right for you. Summary  The Mediterranean diet includes both food and lifestyle choices.  Eat a variety of fresh fruits and vegetables, beans, nuts, seeds, and whole grains.  Limit the amount of red meat and sweets that you eat.  Talk with your health care provider about whether it is safe for you to drink red wine in moderation. This means 1 glass a day for nonpregnant women and 2 glasses a day for men. A glass of wine equals 5 oz (150 mL). This information is not intended to replace advice given to you by your health care provider. Make sure you discuss any questions you have with your health  care provider. Document Released: 12/27/2015 Document Revised: 01/03/2016 Document Reviewed: 12/27/2015 Elsevier Patient Education  2020 [place], Daune Perch, NP  12/28/2018 4:40 PM    [facility]

## 2019-01-12 DIAGNOSIS — S060X9A Concussion with loss of consciousness of unspecified duration, initial encounter: Secondary | ICD-10-CM | POA: Diagnosis not present

## 2019-01-12 DIAGNOSIS — G43909 Migraine, unspecified, not intractable, without status migrainosus: Secondary | ICD-10-CM | POA: Diagnosis not present

## 2019-01-17 DIAGNOSIS — J4520 Mild intermittent asthma, uncomplicated: Secondary | ICD-10-CM | POA: Diagnosis not present

## 2019-01-17 DIAGNOSIS — E782 Mixed hyperlipidemia: Secondary | ICD-10-CM | POA: Diagnosis not present

## 2019-01-17 DIAGNOSIS — I10 Essential (primary) hypertension: Secondary | ICD-10-CM | POA: Diagnosis not present

## 2019-03-14 DIAGNOSIS — I10 Essential (primary) hypertension: Secondary | ICD-10-CM | POA: Diagnosis not present

## 2019-03-14 DIAGNOSIS — R0902 Hypoxemia: Secondary | ICD-10-CM | POA: Diagnosis not present

## 2019-03-14 DIAGNOSIS — I25119 Atherosclerotic heart disease of native coronary artery with unspecified angina pectoris: Secondary | ICD-10-CM | POA: Diagnosis not present

## 2019-03-14 DIAGNOSIS — E782 Mixed hyperlipidemia: Secondary | ICD-10-CM | POA: Diagnosis not present

## 2019-03-14 DIAGNOSIS — E162 Hypoglycemia, unspecified: Secondary | ICD-10-CM | POA: Diagnosis not present

## 2019-03-18 DIAGNOSIS — G44309 Post-traumatic headache, unspecified, not intractable: Secondary | ICD-10-CM | POA: Diagnosis not present

## 2019-03-18 DIAGNOSIS — Z23 Encounter for immunization: Secondary | ICD-10-CM | POA: Diagnosis not present

## 2019-03-18 DIAGNOSIS — S060X9A Concussion with loss of consciousness of unspecified duration, initial encounter: Secondary | ICD-10-CM | POA: Diagnosis not present

## 2019-05-24 DIAGNOSIS — Z0001 Encounter for general adult medical examination with abnormal findings: Secondary | ICD-10-CM | POA: Diagnosis not present

## 2019-05-24 DIAGNOSIS — R5383 Other fatigue: Secondary | ICD-10-CM | POA: Diagnosis not present

## 2019-05-24 DIAGNOSIS — I10 Essential (primary) hypertension: Secondary | ICD-10-CM | POA: Diagnosis not present

## 2019-05-24 DIAGNOSIS — D511 Vitamin B12 deficiency anemia due to selective vitamin B12 malabsorption with proteinuria: Secondary | ICD-10-CM | POA: Diagnosis not present

## 2019-05-24 DIAGNOSIS — E559 Vitamin D deficiency, unspecified: Secondary | ICD-10-CM | POA: Diagnosis not present

## 2019-05-24 DIAGNOSIS — E782 Mixed hyperlipidemia: Secondary | ICD-10-CM | POA: Diagnosis not present

## 2019-05-24 DIAGNOSIS — Z1212 Encounter for screening for malignant neoplasm of rectum: Secondary | ICD-10-CM | POA: Diagnosis not present

## 2019-06-17 ENCOUNTER — Encounter (INDEPENDENT_AMBULATORY_CARE_PROVIDER_SITE_OTHER): Payer: Self-pay | Admitting: *Deleted

## 2019-06-17 DIAGNOSIS — I10 Essential (primary) hypertension: Secondary | ICD-10-CM | POA: Diagnosis not present

## 2019-06-17 DIAGNOSIS — E7849 Other hyperlipidemia: Secondary | ICD-10-CM | POA: Diagnosis not present

## 2019-06-21 DIAGNOSIS — D511 Vitamin B12 deficiency anemia due to selective vitamin B12 malabsorption with proteinuria: Secondary | ICD-10-CM | POA: Diagnosis not present

## 2019-06-21 DIAGNOSIS — J4520 Mild intermittent asthma, uncomplicated: Secondary | ICD-10-CM | POA: Diagnosis not present

## 2019-06-21 DIAGNOSIS — E559 Vitamin D deficiency, unspecified: Secondary | ICD-10-CM | POA: Diagnosis not present

## 2019-06-21 DIAGNOSIS — I10 Essential (primary) hypertension: Secondary | ICD-10-CM | POA: Diagnosis not present

## 2019-06-21 DIAGNOSIS — I25119 Atherosclerotic heart disease of native coronary artery with unspecified angina pectoris: Secondary | ICD-10-CM | POA: Diagnosis not present

## 2019-07-05 ENCOUNTER — Encounter: Payer: Self-pay | Admitting: Neurology

## 2019-07-05 NOTE — Progress Notes (Signed)
Patient not seen.  Did not respond to link.  Then contacted patient who stated that she did not receive link.  Resent link.  When she did not respond, tried calling her back but she did not respond to phone call.

## 2019-07-06 ENCOUNTER — Other Ambulatory Visit: Payer: Self-pay

## 2019-07-06 ENCOUNTER — Encounter: Payer: Self-pay | Admitting: Neurology

## 2019-07-06 ENCOUNTER — Telehealth (INDEPENDENT_AMBULATORY_CARE_PROVIDER_SITE_OTHER): Payer: Medicare Other | Admitting: Neurology

## 2019-07-06 DIAGNOSIS — R519 Headache, unspecified: Secondary | ICD-10-CM

## 2019-07-15 DIAGNOSIS — E7849 Other hyperlipidemia: Secondary | ICD-10-CM | POA: Diagnosis not present

## 2019-07-15 DIAGNOSIS — J4541 Moderate persistent asthma with (acute) exacerbation: Secondary | ICD-10-CM | POA: Diagnosis not present

## 2019-07-15 DIAGNOSIS — I10 Essential (primary) hypertension: Secondary | ICD-10-CM | POA: Diagnosis not present

## 2019-07-17 NOTE — Progress Notes (Signed)
Virtual Visit via Video Note The purpose of this virtual visit is to provide medical care while limiting exposure to the novel coronavirus.    Consent was obtained for video visit:  Yes Answered questions that patient had about telehealth interaction:  Yes I discussed the limitations, risks, security and privacy concerns of performing an evaluation and management service by telemedicine. I also discussed with the patient that there may be a patient responsible charge related to this service. The patient expressed understanding and agreed to proceed.  Pt location: Home Physician Location: office Name of referring provider:  Caryl Bis, MD I connected with Tara Love at patients initiation/request on 07/18/2019 at 12:50 PM EST by video enabled telemedicine application and verified that I am speaking with the correct person using two identifiers. Pt MRN:  [id number] Pt DOB:  1940/11/27 Video Participants:  Tara Love; daughter   History of Present Illness:  Tara Love is a 79 year old female with CAD, HLD and HTN who presents for headache.  History supplemented by referring provider note.  She hit the back of her head in April after slipping.  She has had a headache since then.  It fluctuates from moderate to severe diffuse nonthrobbing headache.  The headache is persistent but his severe half of the time.  She was taking Tylenol daily which eases the pain.  But she has since tried to stop taking it often.  When severe, headaches are associated with nausea.  She denies visual disturbance, photophobia (unless bright light), phonophobia, dizziness, numbness or weakness.  No aggravating factors.  Other than Tylenol, no relieving factors. No neck pain.    At the time of the fall, she had a CT of the head which was reportedly negative for acute intracranial abnormality.    Since the injury, she has had some memory issues.  She once got lost while driving on a familiar route and  had to pull over and collect her thoughts.  She is now repeating stories.  She has trouble remembering recent events.  She has no prior history of memory deficits.  Current NSAIDS:  none Current analgesics:  Tylenol Current triptans:  none Current ergotamine:  none Current anti-emetic:  none Current muscle relaxants:  none Current anti-anxiolytic:  none Current sleep aide:  none Current Antihypertensive medications:  Toprol XL; isosorbide mononitrate ER Current Antidepressant medications:  Cymbalta 60mg  daily Current Anticonvulsant medications:  none Current anti-CGRP:  none Current Vitamins/Herbal/Supplements:  none Current Antihistamines/Decongestants:  none Other therapy:  none Hormone/birth control:  none    Past Medical History: Past Medical History:  Diagnosis Date  . Asthma   . CAD (coronary artery disease)    a. DES to [place] 06/2007. b. Cath 01/2013 due to [facility] nuc at [facility]: nonobst dz, patent stent, EF 55-65%.  . Iron deficiency anemia, unspecified   . Irritable bowel syndrome   . Morbid obesity ([place])   . Other and unspecified hyperlipidemia   . Unspecified asthma(493.90)   . Unspecified essential hypertension     Medications: Outpatient Encounter Medications as of 07/18/2019  Medication Sig  . acetaminophen (TYLENOL) 500 MG tablet Take 500 mg by mouth every 6 (six) hours as needed for pain. For pain in back and knee  . clopidogrel (PLAVIX) 75 MG tablet Take 1 tablet (75 mg total) by mouth daily.  . DULoxetine (CYMBALTA) 30 MG capsule Cymbalta 30 mg capsule,delayed release  . DULoxetine (CYMBALTA) 60 MG capsule Cymbalta 60 mg capsule,delayed release  .  furosemide (LASIX) 20 MG tablet Take 20 mg by mouth daily as needed for fluid.   . isosorbide mononitrate (IMDUR) 60 MG 24 hr tablet Take 1 tablet (60 mg total) by mouth daily.  . metoprolol succinate (TOPROL-XL) 50 MG 24 hr tablet Take 1 tablet (50 mg total) by mouth every morning.  . nitroGLYCERIN (NITROSTAT)  0.4 MG SL tablet Place 1 tablet (0.4 mg total) under the tongue every 5 (five) minutes as needed for chest pain (up to 3 doses).  . ranolazine (RANEXA) 1000 MG SR tablet TAKE 1 TABLET BY MOUTH TWICE DAILY  . simvastatin (ZOCOR) 40 MG tablet Take 40 mg by mouth at bedtime.     No facility-administered encounter medications on file as of 07/18/2019.    Allergies: Allergies  Allergen Reactions  . Codeine Phosphate     REACTION: nausea  . Morphine Sulfate     REACTION: nausea    Family History: Family History  Problem Relation Age of Onset  . Hernia Mother 51  . Cancer Father        Prostate & Hx of CHF    Social History: Social History   Socioeconomic History  . Marital status: Divorced    Spouse name: Not on file  . Number of children: 2  . Years of education: Not on file  . Highest education level: Not on file  Occupational History  . Occupation: RETIRED  Tobacco Use  . Smoking status: Former Smoker    Packs/day: 2.00    Years: 18.00    Pack years: 36.00    Types: Cigarettes    Start date: 05/20/1947    Quit date: 05/19/1965    Years since quitting: 54.1  . Smokeless tobacco: Former Systems developer    Types: Snuff, Chew    Quit date: 05/19/1946  . Tobacco comment: Started smoking at age 4-has not smoked in 39 years/dipped snuff and chewed tobacco during childhood  Substance and Sexual Activity  . Alcohol use: No    Alcohol/week: 0.0 standard drinks  . Drug use: No  . Sexual activity: Not on file  Other Topics Concern  . Not on file  Social History Narrative   Right handed   One story home   Drinks caffeine, usually diet coke or tea   Social Determinants of Health   Financial Resource Strain:   . Difficulty of Paying Living Expenses: Not on file  Food Insecurity:   . Worried About Charity fundraiser in the Last Year: Not on file  . Ran Out of Food in the Last Year: Not on file  Transportation Needs:   . Lack of Transportation (Medical): Not on file  . Lack of  Transportation (Non-Medical): Not on file  Physical Activity:   . Days of Exercise per Week: Not on file  . Minutes of Exercise per Session: Not on file  Stress:   . Feeling of Stress : Not on file  Social Connections:   . Frequency of Communication with Friends and Family: Not on file  . Frequency of Social Gatherings with Friends and Family: Not on file  . Attends Religious Services: Not on file  . Active Member of Clubs or Organizations: Not on file  . Attends Archivist Meetings: Not on file  . Marital Status: Not on file  Intimate Partner Violence:   . Fear of Current or Ex-Partner: Not on file  . Emotionally Abused: Not on file  . Physically Abused: Not on file  . Sexually  Abused: Not on file    Observations/Objective:   There were no vitals taken for this visit.  Assessment and Plan:   1.  Chronic post-traumatic headache, intractable 2.  Short term memory deficits.  May be chronic post-concussion or related to ongoing headaches.  We should definitely evaluate for secondary etiology of symptoms. 1.  MRI of brain without contrast 2.  Recommend checking B12 and TSH.  They would like to have this checked locally at Dr. [place] Jansky office. 3.  For headache, start gabapentin 100mg  at bedtime for a week, then increase to 200mg  at bedtime 4.  Limit use of pain relievers to no more than 2 days out of week to prevent risk of rebound or medication-overuse headache. 5.  Will consider neuropsychological testing 6.  Follow up in 4 months.  Follow Up Instructions:    -I discussed the assessment and treatment plan with the patient. The patient was provided an opportunity to ask questions and all were answered. The patient agreed with the plan and demonstrated an understanding of the instructions.   The patient was advised to call back or seek an in-person evaluation if the symptoms worsen or if the condition fails to improve as anticipated.   Dudley Major, DO

## 2019-07-18 ENCOUNTER — Other Ambulatory Visit: Payer: Self-pay

## 2019-07-18 ENCOUNTER — Encounter: Payer: Self-pay | Admitting: Neurology

## 2019-07-18 ENCOUNTER — Telehealth (INDEPENDENT_AMBULATORY_CARE_PROVIDER_SITE_OTHER): Payer: Medicare Other | Admitting: Neurology

## 2019-07-18 DIAGNOSIS — R413 Other amnesia: Secondary | ICD-10-CM | POA: Diagnosis not present

## 2019-07-18 DIAGNOSIS — G44321 Chronic post-traumatic headache, intractable: Secondary | ICD-10-CM

## 2019-07-18 MED ORDER — GABAPENTIN 100 MG PO CAPS: ORAL_CAPSULE | ORAL | 0 refills | Status: DC

## 2019-07-18 MED ORDER — NORTRIPTYLINE HCL 10 MG PO CAPS: 10.0000 mg | ORAL_CAPSULE | Freq: Every day | ORAL | 0 refills | Status: DC

## 2019-07-20 ENCOUNTER — Other Ambulatory Visit: Payer: Self-pay

## 2019-07-20 DIAGNOSIS — R413 Other amnesia: Secondary | ICD-10-CM

## 2019-07-20 DIAGNOSIS — G44321 Chronic post-traumatic headache, intractable: Secondary | ICD-10-CM

## 2019-07-26 ENCOUNTER — Telehealth: Payer: Self-pay | Admitting: *Deleted

## 2019-07-26 DIAGNOSIS — R195 Other fecal abnormalities: Secondary | ICD-10-CM | POA: Diagnosis not present

## 2019-07-26 NOTE — Telephone Encounter (Signed)
Received fax from [place] at [facility] - patient seen by Dr. Rocco Serene to set up colonoscopy - scheduled for 08/08/2019.  Needs okay to hold her Plavix 7 days prior.

## 2019-07-27 NOTE — Telephone Encounter (Signed)
5 days should be more than sufficient but I suppose 7 days would be fine.

## 2019-07-28 NOTE — Telephone Encounter (Signed)
Noted, will fax back to [facility].

## 2019-08-19 ENCOUNTER — Other Ambulatory Visit: Payer: Self-pay

## 2019-08-19 ENCOUNTER — Ambulatory Visit
Admission: RE | Admit: 2019-08-19 | Discharge: 2019-08-19 | Disposition: A | Discharge status: Home / Self Care | Payer: Medicare Other | Source: Ambulatory Visit | Attending: Neurology | Admitting: Neurology

## 2019-08-19 DIAGNOSIS — G44321 Chronic post-traumatic headache, intractable: Secondary | ICD-10-CM

## 2019-08-19 DIAGNOSIS — R519 Headache, unspecified: Secondary | ICD-10-CM | POA: Diagnosis not present

## 2019-08-19 DIAGNOSIS — R410 Disorientation, unspecified: Secondary | ICD-10-CM | POA: Diagnosis not present

## 2019-08-19 DIAGNOSIS — R413 Other amnesia: Secondary | ICD-10-CM

## 2019-08-23 ENCOUNTER — Telehealth: Payer: Self-pay

## 2019-08-23 NOTE — Telephone Encounter (Signed)
-----   Message from Pieter Partridge, DO sent at 08/21/2019  5:15 PM EDT ----- MRI of brain shows some chronic changes but nothing to explain headaches or confusion

## 2019-08-23 NOTE — Telephone Encounter (Signed)
Patient aware of results.

## 2019-08-29 DIAGNOSIS — Z01818 Encounter for other preprocedural examination: Secondary | ICD-10-CM | POA: Diagnosis not present

## 2019-08-31 DIAGNOSIS — Z886 Allergy status to analgesic agent status: Secondary | ICD-10-CM | POA: Diagnosis not present

## 2019-08-31 DIAGNOSIS — Z8673 Personal history of transient ischemic attack (TIA), and cerebral infarction without residual deficits: Secondary | ICD-10-CM | POA: Diagnosis not present

## 2019-08-31 DIAGNOSIS — J45909 Unspecified asthma, uncomplicated: Secondary | ICD-10-CM | POA: Diagnosis not present

## 2019-08-31 DIAGNOSIS — E7800 Pure hypercholesterolemia, unspecified: Secondary | ICD-10-CM | POA: Diagnosis not present

## 2019-08-31 DIAGNOSIS — I10 Essential (primary) hypertension: Secondary | ICD-10-CM | POA: Diagnosis not present

## 2019-08-31 DIAGNOSIS — R195 Other fecal abnormalities: Secondary | ICD-10-CM | POA: Diagnosis not present

## 2019-08-31 DIAGNOSIS — D126 Benign neoplasm of colon, unspecified: Secondary | ICD-10-CM | POA: Diagnosis not present

## 2019-08-31 DIAGNOSIS — K635 Polyp of colon: Secondary | ICD-10-CM | POA: Diagnosis not present

## 2019-08-31 DIAGNOSIS — D649 Anemia, unspecified: Secondary | ICD-10-CM | POA: Diagnosis not present

## 2019-08-31 DIAGNOSIS — G4730 Sleep apnea, unspecified: Secondary | ICD-10-CM | POA: Diagnosis not present

## 2019-08-31 DIAGNOSIS — K640 First degree hemorrhoids: Secondary | ICD-10-CM | POA: Diagnosis not present

## 2019-08-31 DIAGNOSIS — D120 Benign neoplasm of cecum: Secondary | ICD-10-CM | POA: Diagnosis not present

## 2019-08-31 DIAGNOSIS — I2510 Atherosclerotic heart disease of native coronary artery without angina pectoris: Secondary | ICD-10-CM | POA: Diagnosis not present

## 2019-09-15 DIAGNOSIS — R195 Other fecal abnormalities: Secondary | ICD-10-CM | POA: Diagnosis not present

## 2019-09-15 DIAGNOSIS — D122 Benign neoplasm of ascending colon: Secondary | ICD-10-CM | POA: Diagnosis not present

## 2019-09-15 DIAGNOSIS — D125 Benign neoplasm of sigmoid colon: Secondary | ICD-10-CM | POA: Diagnosis not present

## 2019-09-16 DIAGNOSIS — E7849 Other hyperlipidemia: Secondary | ICD-10-CM | POA: Diagnosis not present

## 2019-09-16 DIAGNOSIS — I10 Essential (primary) hypertension: Secondary | ICD-10-CM | POA: Diagnosis not present

## 2019-09-29 ENCOUNTER — Ambulatory Visit (HOSPITAL_COMMUNITY): Payer: Self-pay | Admitting: Surgery

## 2019-09-29 ENCOUNTER — Other Ambulatory Visit (HOSPITAL_COMMUNITY): Payer: Self-pay | Admitting: Surgery

## 2019-09-29 DIAGNOSIS — K921 Melena: Secondary | ICD-10-CM | POA: Diagnosis not present

## 2019-09-29 DIAGNOSIS — I2510 Atherosclerotic heart disease of native coronary artery without angina pectoris: Secondary | ICD-10-CM | POA: Diagnosis not present

## 2019-09-29 DIAGNOSIS — Z7901 Long term (current) use of anticoagulants: Secondary | ICD-10-CM | POA: Diagnosis not present

## 2019-10-10 ENCOUNTER — Encounter: Payer: Self-pay | Admitting: Family Medicine

## 2019-10-10 ENCOUNTER — Ambulatory Visit (INDEPENDENT_AMBULATORY_CARE_PROVIDER_SITE_OTHER): Payer: Medicare Other | Admitting: Family Medicine

## 2019-10-10 ENCOUNTER — Other Ambulatory Visit: Payer: Self-pay

## 2019-10-10 VITALS — BP 130/70 | HR 68 | Ht 65.5 in | Wt 237.0 lb

## 2019-10-10 DIAGNOSIS — I2510 Atherosclerotic heart disease of native coronary artery without angina pectoris: Secondary | ICD-10-CM

## 2019-10-10 DIAGNOSIS — I10 Essential (primary) hypertension: Secondary | ICD-10-CM | POA: Diagnosis not present

## 2019-10-10 DIAGNOSIS — Z8673 Personal history of transient ischemic attack (TIA), and cerebral infarction without residual deficits: Secondary | ICD-10-CM

## 2019-10-10 DIAGNOSIS — E782 Mixed hyperlipidemia: Secondary | ICD-10-CM

## 2019-10-10 NOTE — Progress Notes (Signed)
Cardiology Office Note  Date: 10/10/2019   ID: Tara, Love [birth year], MRN [id number]  PCP:  Caryl Bis, MD  Cardiologist:  Kate Sable, MD Electrophysiologist:  None   Chief Complaint: Follow-up CAD, HTN, HLD, TIA  History of Present Illness: Tara Love is a 79 y.o. female with a history of  CAD (DES to diagonal 2009) (cardiac cath 2014 due to abnormal nuclear showed nonobstructive disease and patent stent EF 55-65), HTN, HLD, TIA, sleep apnea, obesity, asthma.  Last seen by Daune Perch, NP December 28, 2018 for 43-month follow-up.  She denies any chest pain/pressure, shortness of breath, orthopnea, PND, edema, lightheadedness or syncope.  She had been having some bad headaches and plan to see her PCP for evaluation.   Patient presents today with no particular complaints.  She states the last visit when she was having headaches she discovered she was being exposed to carbon monoxide from a kerosene heater she had been having in her home.  States she has has moved from her previous residence to an apartment and has had no other issues with headaches other than some moderate lingering headaches from exposure.  She denies any progressive anginal or exertional symptoms, palpitations or arrhythmias, orthostatic symptoms, bleeding issues, claudication-like symptoms, PND or orthopnea.  She states she sleeps in a recliner due to her back issues.   Past Medical History:  Diagnosis Date  . Asthma   . CAD (coronary artery disease)    a. DES to [place] 06/2007. b. Cath 01/2013 due to abnormal nuc at [facility]: nonobst dz, patent stent, EF 55-65%.  . Iron deficiency anemia, unspecified   . Irritable bowel syndrome   . Morbid obesity ([place])   . Other and unspecified hyperlipidemia   . Unspecified asthma(493.90)   . Unspecified essential hypertension     Past Surgical History:  Procedure Laterality Date  . CARPAL TUNNEL RELEASE     Bilateral  . CATARACT EXTRACTION W/PHACO  Left 07/14/2016   Procedure: CATARACT EXTRACTION PHACO AND INTRAOCULAR LENS PLACEMENT (IOC);  Surgeon: Tonny Branch, MD;  Location: [facility];  Service: Ophthalmology;  Laterality: Left;  CDE: 7.04  . CATARACT EXTRACTION W/PHACO Right 08/11/2016   Procedure: CATARACT EXTRACTION PHACO AND INTRAOCULAR LENS PLACEMENT (IOC);  Surgeon: Tonny Branch, MD;  Location: [facility];  Service: Ophthalmology;  Laterality: Right;  CDE: 11.58  . CHOLECYSTECTOMY OPEN  1987  . DILATION AND CURETTAGE OF UTERUS    . GASTRIC BYPASS SURGERY  1993   Subtotal gastrectomy  . INCISIONAL HERNIA REPAIR  01/25/2003   with mesh  . LEFT HEART CATHETERIZATION WITH CORONARY ANGIOGRAM N/A 02/15/2013   Procedure: LEFT HEART CATHETERIZATION WITH CORONARY ANGIOGRAM;  Surgeon: Peter M Martinique, MD;  Location: [facility] CATH LAB;  Service: Cardiovascular;  Laterality: N/A;  . TONSILLECTOMY     as a child    Current Outpatient Medications  Medication Sig Dispense Refill  . acetaminophen (TYLENOL) 500 MG tablet Take 1,000-1,500 mg by mouth 2 (two) times daily as needed for moderate pain or headache.     . albuterol (VENTOLIN HFA) 108 (90 Base) MCG/ACT inhaler Inhale 2 puffs into the lungs every 4 (four) hours as needed.    . Cholecalciferol (DIALYVITE VITAMIN D 5000) 125 MCG (5000 UT) capsule Take 5,000 Units by mouth daily.    . clopidogrel (PLAVIX) 75 MG tablet Take 1 tablet (75 mg total) by mouth daily.    . Cyanocobalamin (B-12) 2500 MCG TABS Take 2,500 mcg by mouth  daily.    . gabapentin (NEURONTIN) 100 MG capsule Take 1 capsule at bedtime for a week, then increase to 2 capsules at bedtime (Patient taking differently: Take 100 mg by mouth at bedtime. ) 90 capsule 0  . isosorbide mononitrate (IMDUR) 60 MG 24 hr tablet Take 1 tablet (60 mg total) by mouth daily. 30 tablet 6  . metoprolol succinate (TOPROL-XL) 50 MG 24 hr tablet Take 1 tablet (50 mg total) by mouth every morning.    . nitroGLYCERIN (NITROSTAT) 0.4 MG SL tablet Place 1 tablet (0.4 mg  total) under the tongue every 5 (five) minutes as needed for chest pain (up to 3 doses).    . nortriptyline (PAMELOR) 10 MG capsule Take 10 mg by mouth at bedtime.    . ranolazine (RANEXA) 1000 MG SR tablet TAKE 1 TABLET BY MOUTH TWICE DAILY (Patient taking differently: Take 1,000 mg by mouth 2 (two) times daily. ) 60 tablet 6  . simvastatin (ZOCOR) 40 MG tablet Take 40 mg by mouth daily.      No current facility-administered medications for this visit.   Allergies:  Codeine phosphate and Morphine sulfate   Social History: The patient  reports that she quit smoking about 54 years ago. Her smoking use included cigarettes. She started smoking about 72 years ago. She has a 36.00 pack-year smoking history. She quit smokeless tobacco use about 73 years ago.  Her smokeless tobacco use included snuff and chew. She reports that she does not drink alcohol or use drugs.   Family History: The patient's family history includes Cancer in her father; Hernia (age of onset: 68) in her mother.   ROS:  Please see the history of present illness. Otherwise, complete review of systems is positive for none.  All other systems are reviewed and negative.   Physical Exam: VS:  BP 130/70   Pulse 68   Ht 5' 5.5" (1.664 m)   Wt 237 lb (107.5 kg)   SpO2 96%   BMI 38.84 kg/m , BMI Body mass index is 38.84 kg/m.  Wt Readings from Last 3 Encounters:  10/10/19 237 lb (107.5 kg)  12/28/18 237 lb (107.5 kg)  05/31/18 224 lb (101.6 kg)    General: Patient appears comfortable at rest. HEENT: Conjunctiva and lids normal, oropharynx clear with moist mucosa. Neck: Supple, no elevated JVP or carotid bruits, no thyromegaly. Lungs: Clear to auscultation, nonlabored breathing at rest. Cardiac: Regular rate and rhythm, no S3 or significant systolic murmur, no pericardial rub. Abdomen: Soft, nontender, no hepatomegaly, bowel sounds present, no guarding or rebound. Extremities: No pitting edema, distal pulses 2+. Skin: Warm  and dry. Musculoskeletal: No kyphosis. Neuropsychiatric: Alert and oriented x3, affect grossly appropriate.  ECG:    Recent Labwork: No results found for requested labs within last 8760 hours.     Component Value Date/Time   CHOL 125 02/15/2013 0500   TRIG 227 (H) 02/15/2013 0500   HDL 49 02/15/2013 0500   CHOLHDL 2.6 02/15/2013 0500   VLDL 45 (H) 02/15/2013 0500   LDLCALC 31 02/15/2013 0500    Other Studies Reviewed Today:  MRI of head 08/19/2019 secondary to confusion, headaches, memory issues. IMPRESSION: No intracranial mass. No substantial parenchymal volume loss or white matter changes for age. Small chronic infarcts of the right basal ganglia.  Nuclear Lexiscan stress 02/14/2013 Nuclear medical stress test results: There is mild to moderate reversibility involving the anterior wall and lateral wall.   Normal left ventricular systolic function. Left ventricular ejection  fraction equals 68%,   Echocardiogram 02/14/2013   Assessment and Plan:  1. CAD in native artery   2. Essential (primary) hypertension   3. Mixed hyperlipidemia   4. History of CVA (cerebrovascular accident)    1. CAD in native artery DES to diag 2009, Cath 2014 patent stent. Non obstructive disease. Normal stress myoview 2/2 CP 2019. On Imdur and Ranexa.  Denies any current anginal or exertional symptoms.  Continue Imdur 60 mg, sublingual nitroglycerin as needed, Ranexa 1000 mg p.o. twice daily, Toprol XL 50 mg daily.  2. Essential (primary) hypertension Blood pressure is well controlled on current medications.  Continue Toprol-XL 50 mg daily.   3. Mixed hyperlipidemia Patient states she has not had a lipid level in quite some time by PCP.  Continue simvastatin 40 mg daily. Last lipid panel 2018 showed total cholesterol 134, triglycerides 224, HDL 63, LDL 26.  4. History of CVA (cerebrovascular accident) On Plavix. Patient denies knowledge of previous CVA.  Continue Plavix 75 mg  daily.   Medication Adjustments/Labs and Tests Ordered: Current medicines are reviewed at length with the patient today.  Concerns regarding medicines are outlined above.   Disposition: Follow-up with Dr. Bronson Ing or APP 6 months  Signed, Levell July, NP 10/10/2019 2:14 PM    [facility] at [place], [place], [place] [address] Phone: [phone]; Fax: [phone]

## 2019-10-10 NOTE — Patient Instructions (Addendum)
Medication Instructions:    Your physician recommends that you continue on your current medications as directed. Please refer to the Current Medication list given to you today.  Labwork:  NONE  Testing/Procedures:  NONE  Follow-Up:  Your physician recommends that you schedule a follow-up appointment in: 6 months (office)  Any Other Special Instructions Will Be Listed Below (If Applicable).  If you need a refill on your cardiac medications before your next appointment, please call your pharmacy. 

## 2019-10-14 ENCOUNTER — Other Ambulatory Visit (HOSPITAL_COMMUNITY)
Admission: RE | Admit: 2019-10-14 | Discharge: 2019-10-14 | Disposition: A | Discharge status: Home / Self Care | Payer: Medicare Other | Source: Ambulatory Visit | Attending: Surgery | Admitting: Surgery

## 2019-10-14 DIAGNOSIS — Z20822 Contact with and (suspected) exposure to covid-19: Secondary | ICD-10-CM | POA: Insufficient documentation

## 2019-10-14 DIAGNOSIS — Z01812 Encounter for preprocedural laboratory examination: Secondary | ICD-10-CM | POA: Insufficient documentation

## 2019-10-14 LAB — SARS CORONAVIRUS 2 (TAT 6-24 HRS): SARS Coronavirus 2: NEGATIVE

## 2019-10-16 NOTE — H&P (Signed)
Tara Love  Location: [facility] Patient #: [id number] DOB: 11-Mar-1941 Divorced / Language: Tara Love / Race: White Female  History of Present Illness   The patient is a 79 year old female who presents with a complaint of for upper endo.  The PCP is Dr. Kern Alberta.  The patient was referred by Dr. Adelina Mings. Tara Love, one daughter, is with her today.  The patient was noted to have blood in her stool. Apparently her hgb is normal. At this time, I do not have any labs on her. She had a colonoscopy by Dr. Adelina Mings, [facility]. She found some benign polyps, but nothing to explain the blood in her stool. She had a gastric bypass about 5 in [place]. She said that she weighed over 300 pounds prior to surgery. She lost to below 200 pounds. She now weighs 231 pounds, BMI - 39. She does have some occasional abdominal discomfort when eating, but this is not chronic. She's had no recent weight loss or weight change. She has no known liver or pancreas disease. I explained the upper endoscopy procedure to her, with anesthesia similar to the colonoscopy. The risk included bleeding, bowel perforation, or aspiration. I told her there is a good chance find no cause for her blood loss in her stool.  Plan: 1. Upper endoscopy  Review of Systems as stated in this history (HPI) or in the review of systems. Otherwise all other 12 point ROS are negative  Past Medical History: 1. Seen by Dr. Metta Clines, Neurology  For HA - gabapentin and nortryptiline have helped  2. CAD  Her cardiologist is Dr. Kate Sable DES - 06/2007 3. Anticoagulated  On Plavix 4. HTN 5. Chronic post traumatic HA 6. Short term memory deficits 7. Cholecystectomy - 1980's 8. Open gastric bypass - [place]  Social History: She lives by herself. divorced, but her husband lives next door. Her 2  daughters live nearby [place], one daughter, is with her today. (phone: [phone])  The patient's family history was non contributory.   Allergies (April Staton, CMA; 09/29/2019 10:30 AM) Codeine and Related  Morphine Derivatives   Medication History (April Staton, CMA; 09/29/2019 10:32 AM) Clopidogrel Bisulfate (75MG  Tablet, Oral) Active. Isosorbide Mononitrate ER (60MG  Tablet ER 24HR, Oral) Active. Ranexa (Oral) Specific strength unknown - Active. Gabapentin (100MG  Capsule, Oral) Active. Simvastatin (40MG  Tablet, Oral) Active. Metoprolol Succinate ER (50MG  Tablet ER 24HR, Oral) Active. Nortriptyline HCl (10MG  Capsule, Oral) Active. B Complex B-12 (Oral) Active. Vitamin D-3 (Oral) Specific strength unknown - Active. Medications Reconciled  Vitals (April Staton CMA; 09/29/2019 10:33 AM) 09/29/2019 10:32 AM Weight: 231.38 lb Height: 64in Body Surface Area: 2.08 m Body Mass Index: 39.72 kg/m  Temp.: 97.70F (Oral)  Pulse: 79 (Regular)  BP: 114/78(Sitting, Left Arm, Standard)   Physical Exam  General: WN older obese WF who is alert. She is wearing a mask. HEENT: Normal. Pupils equal.  Neck: Supple. No mass. No thyroid mass. Lymph Nodes: No supraclavicular or cervical nodes.  Lungs: Clear to auscultation and symmetric breath sounds. Heart: RRR. No murmur or rub.  Abdomen: Soft. No mass. No tenderness. No hernia. Normal bowel sounds.  Obese. Upper midline scar. Rectal: Not done.  Extremities: Good strength and ROM in upper and lower extremities. Needs help getting on the exam table.   Assessment & Plan  1.  BLOOD IN STOOL (K92.1)  Plan:  1. Upper endoscopy (contact daughter - Tara Love [id number] about  procedure)  2.  ANTICOAGULATED (Z79.01)  Impression: On Plavix  To hold Plavix for 5 days prior to endoscopy 3.  Open gastric bypass - [place] 4.  CORONARY ARTERY DISEASE (I25.10)  DES - 06/2007  Her  cardiologist is Dr. Kate Sable  Was just seen by Levell July, NP, for [facility]  5. Seen by Dr. Metta Clines, Neurology  For HA - gabapentin and nortryptiline have helped  6. HTN 7. Chronic post traumatic HA 8. Short term memory deficits  Alphonsa Overall, MD, [facility] Office phone:  [phone]

## 2019-10-18 ENCOUNTER — Other Ambulatory Visit: Payer: Self-pay

## 2019-10-18 ENCOUNTER — Encounter (HOSPITAL_COMMUNITY): Payer: Self-pay | Admitting: Surgery

## 2019-10-18 ENCOUNTER — Ambulatory Visit (HOSPITAL_COMMUNITY): Payer: Medicare Other | Admitting: Anesthesiology

## 2019-10-18 ENCOUNTER — Encounter (HOSPITAL_COMMUNITY)
Admission: RE | Disposition: A | Discharge status: Home / Self Care | Payer: Self-pay | Source: Home / Self Care | Attending: Surgery

## 2019-10-18 ENCOUNTER — Ambulatory Visit (HOSPITAL_COMMUNITY)
Admission: RE | Admit: 2019-10-18 | Discharge: 2019-10-18 | Disposition: A | Discharge status: Home / Self Care | Payer: Medicare Other | Attending: Surgery | Admitting: Surgery

## 2019-10-18 DIAGNOSIS — I2510 Atherosclerotic heart disease of native coronary artery without angina pectoris: Secondary | ICD-10-CM | POA: Insufficient documentation

## 2019-10-18 DIAGNOSIS — Z9884 Bariatric surgery status: Secondary | ICD-10-CM | POA: Diagnosis not present

## 2019-10-18 DIAGNOSIS — K921 Melena: Secondary | ICD-10-CM | POA: Insufficient documentation

## 2019-10-18 DIAGNOSIS — E785 Hyperlipidemia, unspecified: Secondary | ICD-10-CM | POA: Diagnosis not present

## 2019-10-18 DIAGNOSIS — Z7902 Long term (current) use of antithrombotics/antiplatelets: Secondary | ICD-10-CM | POA: Diagnosis not present

## 2019-10-18 DIAGNOSIS — Z6833 Body mass index (BMI) 33.0-33.9, adult: Secondary | ICD-10-CM | POA: Diagnosis not present

## 2019-10-18 DIAGNOSIS — E669 Obesity, unspecified: Secondary | ICD-10-CM | POA: Insufficient documentation

## 2019-10-18 DIAGNOSIS — Z9049 Acquired absence of other specified parts of digestive tract: Secondary | ICD-10-CM | POA: Diagnosis not present

## 2019-10-18 DIAGNOSIS — I10 Essential (primary) hypertension: Secondary | ICD-10-CM | POA: Insufficient documentation

## 2019-10-18 DIAGNOSIS — R519 Headache, unspecified: Secondary | ICD-10-CM | POA: Diagnosis not present

## 2019-10-18 DIAGNOSIS — R413 Other amnesia: Secondary | ICD-10-CM | POA: Insufficient documentation

## 2019-10-18 HISTORY — DX: Sleep apnea, unspecified: G47.30

## 2019-10-18 HISTORY — PX: BIOPSY: SHX5522

## 2019-10-18 HISTORY — PX: ESOPHAGOGASTRODUODENOSCOPY (EGD) WITH PROPOFOL: SHX5813

## 2019-10-18 SURGERY — BIOPSY
Anesthesia: Monitor Anesthesia Care

## 2019-10-18 MED ORDER — LIDOCAINE 2% (20 MG/ML) 5 ML SYRINGE
INTRAMUSCULAR | Status: DC | PRN
  Administered 2019-10-18: 100 mg via INTRAVENOUS

## 2019-10-18 MED ORDER — PROPOFOL 10 MG/ML IV BOLUS
INTRAVENOUS | Status: DC | PRN
  Administered 2019-10-18 (×3): 20 mg via INTRAVENOUS

## 2019-10-18 MED ORDER — SODIUM CHLORIDE 0.9 % IV SOLN: INTRAVENOUS | Status: DC

## 2019-10-18 MED ORDER — PROPOFOL 500 MG/50ML IV EMUL
INTRAVENOUS | Status: DC | PRN
  Administered 2019-10-18: 100 ug/kg/min via INTRAVENOUS

## 2019-10-18 MED ORDER — PROPOFOL 10 MG/ML IV BOLUS
INTRAVENOUS | Status: AC
  Filled 2019-10-18: qty 20

## 2019-10-18 MED ORDER — LACTATED RINGERS IV SOLN: INTRAVENOUS | Status: DC

## 2019-10-18 NOTE — Op Note (Signed)
10/18/2019  1:45 PM  PATIENT:  Tara Love, 79 y.o., female, MRN: [id number]  PREOP DIAGNOSIS:  Blood in stool and history of gastric bypass  POSTOP DIAGNOSIS:   Blood in stool and history of gastric bypass  PROCEDURE:  Esophagogastrojejunoscopy  SURGEON:   Alphonsa Overall, M.D.  ANESTHESIA:   MAC anesthesia by anesthesia Anesthesiologist: Lyn Hollingshead, MD CRNA: Sharlette Dense, CRNA  INDICATIONS FOR PROCEDURE:  Tara Love is a 79 y.o. (DOB: 1941/01/20)  white female whose primary care physician is Caryl Bis, MD and comes for upper endoscopy to evaluate source of GI bleeding.  The patient had a RYGB in 1993.   The indications and risks of the endoscopy were explained to the patient.  The risks include, but are not limited to, perforation, bleeding, or injury to the bowel.  If balloon dilatation is needed, the risk of perforation is higher.  PROCEDURE:  The patient was in room 1 at [facility] endoscopy unit.  The patient was monitored with a pulse oximetry, BP cuff, and EKG.  The patient has nasal O2 flowing during the procedure.  She had anesthesia administer MAC.   A time was held prior to the procedure.   The patient was positioned in the left lateral decubitus position.   A flexible Olympus endoscope was passed down the throat without difficulty.   Findings include:   Esophagus:   Normal   GE junction at:  37 cm   Stomach pouch: Normal except for pale mucosa.  No ulcers or mass   Gastrojejunal anastomosis:   44 cm   Efferent jejunal limb:  Normal for 15 cm Afferent jejunal limb:  Unremarkable   CLO test:  Done  PLAN:   Photos taken and given to patient.    I spoke to the daughter Tara Love at the end of the operation.  Alphonsa Overall, MD, [facility] Office phone:  [phone]

## 2019-10-18 NOTE — Transfer of Care (Signed)
Immediate Anesthesia Transfer of Care Note  Patient: Tara Love  Procedure(s) Performed: UPPER ENDOSCOPY (N/A ) BIOPSY  Patient Location: Endoscopy Unit  Anesthesia Type:MAC  Level of Consciousness: awake, alert  and oriented  Airway & Oxygen Therapy: Patient Spontanous Breathing and Patient connected to face mask oxygen  Post-op Assessment: Report given to RN and Post -op Vital signs reviewed and stable  Post vital signs: Reviewed and stable  Last Vitals:  Vitals Value Taken Time  BP    Temp    Pulse    Resp 15 10/18/19 1334  SpO2    Vitals shown include unvalidated device data.  Last Pain:  Vitals:   10/18/19 1301  TempSrc: Oral  PainSc: 0-No pain         Complications: No apparent anesthesia complications

## 2019-10-18 NOTE — Interval H&P Note (Signed)
History and Physical Interval Note:  10/18/2019 1:12 PM  Tara Love  has presented today for surgery, with the diagnosis of BLOOD IN STOOL.  The various methods of treatment have been discussed with the patient and family.  Her daughter Juliann Pulse is here with her.  After consideration of risks, benefits and other options for treatment, the patient has consented to  Procedure(s): UPPER ENDOSCOPY (N/A) as a surgical intervention.  The patient's history has been reviewed, patient examined, no change in status, stable for surgery.  I have reviewed the patient's chart and labs.  Questions were answered to the patient's satisfaction.     Shann Medal

## 2019-10-18 NOTE — Anesthesia Postprocedure Evaluation (Signed)
Anesthesia Post Note  Patient: Tara Love  Procedure(s) Performed: UPPER ENDOSCOPY (N/A ) BIOPSY     Patient location during evaluation: Endoscopy Anesthesia Type: MAC Level of consciousness: awake Pain management: pain level controlled Vital Signs Assessment: post-procedure vital signs reviewed and stable Respiratory status: spontaneous breathing Cardiovascular status: stable Postop Assessment: no apparent nausea or vomiting Anesthetic complications: no    Last Vitals:  Vitals:   10/18/19 1340 10/18/19 1348  BP: 135/68 120/62  Pulse: 74 66  Resp: (!) 22 (!) 21  Temp:    SpO2: 96% 95%    Last Pain:  Vitals:   10/18/19 1348  TempSrc:   PainSc: 0-No pain   Pain Goal:                   Huston Foley

## 2019-10-18 NOTE — Anesthesia Preprocedure Evaluation (Signed)
Anesthesia Evaluation  Patient identified by MRN, date of birth, ID band Patient awake    Reviewed: Allergy & Precautions, NPO status , Patient's Chart, lab work & pertinent test results  Airway Mallampati: I  TM Distance: >3 FB     Dental  (+) Edentulous Upper, Partial Lower   Pulmonary asthma , sleep apnea and Continuous Positive Airway Pressure Ventilation , former smoker,    breath sounds clear to auscultation       Cardiovascular hypertension, Pt. on medications and Pt. on home beta blockers + CAD, + Cardiac Stents and + Peripheral Vascular Disease  Normal cardiovascular exam Rhythm:Regular Rate:Normal     Neuro/Psych negative psych ROS   GI/Hepatic Neg liver ROS,   Endo/Other  Morbid obesity  Renal/GU negative Renal ROS  negative genitourinary   Musculoskeletal   Abdominal (+) + obese,   Peds  Hematology   Anesthesia Other Findings   Reproductive/Obstetrics                             Anesthesia Physical  Anesthesia Plan  ASA: III  Anesthesia Plan: MAC   Post-op Pain Management:    Induction: Intravenous  PONV Risk Score and Plan: Propofol infusion and TIVA  Airway Management Planned: Nasal Cannula, Simple Face Mask and Natural Airway  Additional Equipment: None  Intra-op Plan:   Post-operative Plan:   Informed Consent: I have reviewed the patients History and Physical, chart, labs and discussed the procedure including the risks, benefits and alternatives for the proposed anesthesia with the patient or authorized representative who has indicated his/her understanding and acceptance.     Dental advisory given  Plan Discussed with: CRNA  Anesthesia Plan Comments:         Anesthesia Quick Evaluation

## 2019-10-18 NOTE — Discharge Instructions (Signed)
[facility] - DISCHARGE INSTRUCTIONS TO PATIENT  Activity:  Driving - May drive starting tomorrow                        Practice your Covid-19 protection:  Wear a mask, social distance, and wash your hands frequently  Diet:  As tolerated  Follow up appointment:  No follow up necessary.  Will call about stomach biopsy.  Check with office in one week, if you have not heard. Call Dr. Pollie Friar office [facility]) at [phone] for question.  Medications and dosages:  Resume your home medications.  Hold Plavix for 2 days, then may restart on Friday, 6/4.  Call Dr. Lucia Gaskins or his office  [phone]) if you have:  Persistent nausea and vomiting,  Redness, tenderness, or signs of infection (pain, swelling, redness, odor or green/yellow discharge around the site),  Difficulty breathing, headache or visual disturbances,  Any other questions or concerns you may have after discharge.  In an emergency, call 911 or go to an Emergency Department at a nearby hospital.

## 2019-10-18 NOTE — Anesthesia Procedure Notes (Addendum)
Date/Time: 10/18/2019 1:17 PM Performed by: Sharlette Dense, CRNA Oxygen Delivery Method: Simple face mask

## 2019-10-19 ENCOUNTER — Encounter: Payer: Self-pay | Admitting: *Deleted

## 2019-10-19 LAB — CLOTEST (H. PYLORI), BIOPSY: Helicobacter screen: NEGATIVE

## 2019-10-26 DIAGNOSIS — N183 Chronic kidney disease, stage 3 unspecified: Secondary | ICD-10-CM | POA: Diagnosis not present

## 2019-10-26 DIAGNOSIS — I10 Essential (primary) hypertension: Secondary | ICD-10-CM | POA: Diagnosis not present

## 2019-10-26 DIAGNOSIS — R5383 Other fatigue: Secondary | ICD-10-CM | POA: Diagnosis not present

## 2019-10-26 DIAGNOSIS — E162 Hypoglycemia, unspecified: Secondary | ICD-10-CM | POA: Diagnosis not present

## 2019-10-26 DIAGNOSIS — E782 Mixed hyperlipidemia: Secondary | ICD-10-CM | POA: Diagnosis not present

## 2019-10-27 DIAGNOSIS — J4520 Mild intermittent asthma, uncomplicated: Secondary | ICD-10-CM | POA: Diagnosis not present

## 2019-10-27 DIAGNOSIS — D511 Vitamin B12 deficiency anemia due to selective vitamin B12 malabsorption with proteinuria: Secondary | ICD-10-CM | POA: Diagnosis not present

## 2019-10-27 DIAGNOSIS — E559 Vitamin D deficiency, unspecified: Secondary | ICD-10-CM | POA: Diagnosis not present

## 2019-10-27 DIAGNOSIS — I10 Essential (primary) hypertension: Secondary | ICD-10-CM | POA: Diagnosis not present

## 2019-10-27 DIAGNOSIS — I25119 Atherosclerotic heart disease of native coronary artery with unspecified angina pectoris: Secondary | ICD-10-CM | POA: Diagnosis not present

## 2019-11-07 DIAGNOSIS — Z23 Encounter for immunization: Secondary | ICD-10-CM | POA: Diagnosis not present

## 2019-11-11 DIAGNOSIS — H26493 Other secondary cataract, bilateral: Secondary | ICD-10-CM | POA: Diagnosis not present

## 2019-11-11 DIAGNOSIS — Z961 Presence of intraocular lens: Secondary | ICD-10-CM | POA: Diagnosis not present

## 2019-11-16 DIAGNOSIS — N183 Chronic kidney disease, stage 3 unspecified: Secondary | ICD-10-CM | POA: Diagnosis not present

## 2019-11-16 DIAGNOSIS — E7849 Other hyperlipidemia: Secondary | ICD-10-CM | POA: Diagnosis not present

## 2019-11-16 DIAGNOSIS — I129 Hypertensive chronic kidney disease with stage 1 through stage 4 chronic kidney disease, or unspecified chronic kidney disease: Secondary | ICD-10-CM | POA: Diagnosis not present

## 2019-11-25 ENCOUNTER — Ambulatory Visit: Payer: Medicare Other | Admitting: Neurology

## 2020-01-16 DIAGNOSIS — H16223 Keratoconjunctivitis sicca, not specified as Sjogren's, bilateral: Secondary | ICD-10-CM | POA: Diagnosis not present

## 2020-01-16 DIAGNOSIS — H26493 Other secondary cataract, bilateral: Secondary | ICD-10-CM | POA: Diagnosis not present

## 2020-01-16 DIAGNOSIS — H43812 Vitreous degeneration, left eye: Secondary | ICD-10-CM | POA: Diagnosis not present

## 2020-01-17 DIAGNOSIS — E7849 Other hyperlipidemia: Secondary | ICD-10-CM | POA: Diagnosis not present

## 2020-01-17 DIAGNOSIS — N183 Chronic kidney disease, stage 3 unspecified: Secondary | ICD-10-CM | POA: Diagnosis not present

## 2020-01-17 DIAGNOSIS — I129 Hypertensive chronic kidney disease with stage 1 through stage 4 chronic kidney disease, or unspecified chronic kidney disease: Secondary | ICD-10-CM | POA: Diagnosis not present

## 2020-03-01 DIAGNOSIS — E782 Mixed hyperlipidemia: Secondary | ICD-10-CM | POA: Diagnosis not present

## 2020-03-01 DIAGNOSIS — Z23 Encounter for immunization: Secondary | ICD-10-CM | POA: Diagnosis not present

## 2020-03-01 DIAGNOSIS — I25119 Atherosclerotic heart disease of native coronary artery with unspecified angina pectoris: Secondary | ICD-10-CM | POA: Diagnosis not present

## 2020-03-01 DIAGNOSIS — I10 Essential (primary) hypertension: Secondary | ICD-10-CM | POA: Diagnosis not present

## 2020-03-01 DIAGNOSIS — D511 Vitamin B12 deficiency anemia due to selective vitamin B12 malabsorption with proteinuria: Secondary | ICD-10-CM | POA: Diagnosis not present

## 2020-03-01 DIAGNOSIS — R5383 Other fatigue: Secondary | ICD-10-CM | POA: Diagnosis not present

## 2020-03-01 DIAGNOSIS — E559 Vitamin D deficiency, unspecified: Secondary | ICD-10-CM | POA: Diagnosis not present

## 2020-03-17 DIAGNOSIS — N183 Chronic kidney disease, stage 3 unspecified: Secondary | ICD-10-CM | POA: Diagnosis not present

## 2020-03-17 DIAGNOSIS — I129 Hypertensive chronic kidney disease with stage 1 through stage 4 chronic kidney disease, or unspecified chronic kidney disease: Secondary | ICD-10-CM | POA: Diagnosis not present

## 2020-03-17 DIAGNOSIS — E7849 Other hyperlipidemia: Secondary | ICD-10-CM | POA: Diagnosis not present

## 2020-05-03 ENCOUNTER — Ambulatory Visit: Payer: Medicare Other | Admitting: Cardiovascular Disease

## 2020-07-04 DIAGNOSIS — J4520 Mild intermittent asthma, uncomplicated: Secondary | ICD-10-CM | POA: Diagnosis not present

## 2020-07-04 DIAGNOSIS — I25119 Atherosclerotic heart disease of native coronary artery with unspecified angina pectoris: Secondary | ICD-10-CM | POA: Diagnosis not present

## 2020-07-04 DIAGNOSIS — I10 Essential (primary) hypertension: Secondary | ICD-10-CM | POA: Diagnosis not present

## 2020-07-04 DIAGNOSIS — D511 Vitamin B12 deficiency anemia due to selective vitamin B12 malabsorption with proteinuria: Secondary | ICD-10-CM | POA: Diagnosis not present

## 2020-07-04 DIAGNOSIS — Z7189 Other specified counseling: Secondary | ICD-10-CM | POA: Diagnosis not present

## 2020-07-04 DIAGNOSIS — E7849 Other hyperlipidemia: Secondary | ICD-10-CM | POA: Diagnosis not present

## 2020-07-04 DIAGNOSIS — N289 Disorder of kidney and ureter, unspecified: Secondary | ICD-10-CM | POA: Diagnosis not present

## 2020-07-04 DIAGNOSIS — E559 Vitamin D deficiency, unspecified: Secondary | ICD-10-CM | POA: Diagnosis not present

## 2020-07-16 DIAGNOSIS — I129 Hypertensive chronic kidney disease with stage 1 through stage 4 chronic kidney disease, or unspecified chronic kidney disease: Secondary | ICD-10-CM | POA: Diagnosis not present

## 2020-07-16 DIAGNOSIS — N183 Chronic kidney disease, stage 3 unspecified: Secondary | ICD-10-CM | POA: Diagnosis not present

## 2020-07-16 DIAGNOSIS — E7849 Other hyperlipidemia: Secondary | ICD-10-CM | POA: Diagnosis not present

## 2020-07-26 DIAGNOSIS — M65332 Trigger finger, left middle finger: Secondary | ICD-10-CM | POA: Diagnosis not present

## 2020-07-30 DIAGNOSIS — M6530 Trigger finger, unspecified finger: Secondary | ICD-10-CM | POA: Diagnosis not present

## 2020-07-30 DIAGNOSIS — Z01818 Encounter for other preprocedural examination: Secondary | ICD-10-CM | POA: Diagnosis not present

## 2020-08-01 DIAGNOSIS — Z87891 Personal history of nicotine dependence: Secondary | ICD-10-CM | POA: Diagnosis not present

## 2020-08-01 DIAGNOSIS — Z79899 Other long term (current) drug therapy: Secondary | ICD-10-CM | POA: Diagnosis not present

## 2020-08-01 DIAGNOSIS — M65332 Trigger finger, left middle finger: Secondary | ICD-10-CM | POA: Diagnosis not present

## 2020-08-01 DIAGNOSIS — Z885 Allergy status to narcotic agent status: Secondary | ICD-10-CM | POA: Diagnosis not present

## 2020-08-01 DIAGNOSIS — I2510 Atherosclerotic heart disease of native coronary artery without angina pectoris: Secondary | ICD-10-CM | POA: Diagnosis not present

## 2020-08-01 DIAGNOSIS — I10 Essential (primary) hypertension: Secondary | ICD-10-CM | POA: Diagnosis not present

## 2020-08-01 DIAGNOSIS — Z7902 Long term (current) use of antithrombotics/antiplatelets: Secondary | ICD-10-CM | POA: Diagnosis not present

## 2020-08-01 DIAGNOSIS — E785 Hyperlipidemia, unspecified: Secondary | ICD-10-CM | POA: Diagnosis not present

## 2020-08-15 DIAGNOSIS — N183 Chronic kidney disease, stage 3 unspecified: Secondary | ICD-10-CM | POA: Diagnosis not present

## 2020-08-15 DIAGNOSIS — E7849 Other hyperlipidemia: Secondary | ICD-10-CM | POA: Diagnosis not present

## 2020-08-15 DIAGNOSIS — I129 Hypertensive chronic kidney disease with stage 1 through stage 4 chronic kidney disease, or unspecified chronic kidney disease: Secondary | ICD-10-CM | POA: Diagnosis not present

## 2020-10-12 DIAGNOSIS — R5383 Other fatigue: Secondary | ICD-10-CM | POA: Diagnosis not present

## 2020-10-12 DIAGNOSIS — J4520 Mild intermittent asthma, uncomplicated: Secondary | ICD-10-CM | POA: Diagnosis not present

## 2020-10-12 DIAGNOSIS — N1831 Chronic kidney disease, stage 3a: Secondary | ICD-10-CM | POA: Diagnosis not present

## 2020-10-12 DIAGNOSIS — D511 Vitamin B12 deficiency anemia due to selective vitamin B12 malabsorption with proteinuria: Secondary | ICD-10-CM | POA: Diagnosis not present

## 2020-10-12 DIAGNOSIS — G309 Alzheimer's disease, unspecified: Secondary | ICD-10-CM | POA: Diagnosis not present

## 2020-10-12 DIAGNOSIS — I10 Essential (primary) hypertension: Secondary | ICD-10-CM | POA: Diagnosis not present

## 2020-10-12 DIAGNOSIS — Z23 Encounter for immunization: Secondary | ICD-10-CM | POA: Diagnosis not present

## 2020-10-12 DIAGNOSIS — E7849 Other hyperlipidemia: Secondary | ICD-10-CM | POA: Diagnosis not present

## 2020-10-15 DIAGNOSIS — I129 Hypertensive chronic kidney disease with stage 1 through stage 4 chronic kidney disease, or unspecified chronic kidney disease: Secondary | ICD-10-CM | POA: Diagnosis not present

## 2020-10-15 DIAGNOSIS — N183 Chronic kidney disease, stage 3 unspecified: Secondary | ICD-10-CM | POA: Diagnosis not present

## 2020-10-15 DIAGNOSIS — E7849 Other hyperlipidemia: Secondary | ICD-10-CM | POA: Diagnosis not present

## 2020-10-25 DIAGNOSIS — N1831 Chronic kidney disease, stage 3a: Secondary | ICD-10-CM | POA: Diagnosis not present

## 2020-10-25 DIAGNOSIS — R5383 Other fatigue: Secondary | ICD-10-CM | POA: Diagnosis not present

## 2020-10-25 DIAGNOSIS — Z1329 Encounter for screening for other suspected endocrine disorder: Secondary | ICD-10-CM | POA: Diagnosis not present

## 2020-10-25 DIAGNOSIS — E782 Mixed hyperlipidemia: Secondary | ICD-10-CM | POA: Diagnosis not present

## 2020-10-25 DIAGNOSIS — E7849 Other hyperlipidemia: Secondary | ICD-10-CM | POA: Diagnosis not present

## 2020-10-30 DIAGNOSIS — N1831 Chronic kidney disease, stage 3a: Secondary | ICD-10-CM | POA: Diagnosis not present

## 2020-10-30 DIAGNOSIS — J4520 Mild intermittent asthma, uncomplicated: Secondary | ICD-10-CM | POA: Diagnosis not present

## 2020-10-30 DIAGNOSIS — Z0001 Encounter for general adult medical examination with abnormal findings: Secondary | ICD-10-CM | POA: Diagnosis not present

## 2020-10-30 DIAGNOSIS — E559 Vitamin D deficiency, unspecified: Secondary | ICD-10-CM | POA: Diagnosis not present

## 2020-10-30 DIAGNOSIS — G309 Alzheimer's disease, unspecified: Secondary | ICD-10-CM | POA: Diagnosis not present

## 2020-10-30 DIAGNOSIS — D511 Vitamin B12 deficiency anemia due to selective vitamin B12 malabsorption with proteinuria: Secondary | ICD-10-CM | POA: Diagnosis not present

## 2020-10-30 DIAGNOSIS — E7849 Other hyperlipidemia: Secondary | ICD-10-CM | POA: Diagnosis not present

## 2020-10-30 DIAGNOSIS — I10 Essential (primary) hypertension: Secondary | ICD-10-CM | POA: Diagnosis not present

## 2020-12-16 DIAGNOSIS — E7849 Other hyperlipidemia: Secondary | ICD-10-CM | POA: Diagnosis not present

## 2020-12-16 DIAGNOSIS — N183 Chronic kidney disease, stage 3 unspecified: Secondary | ICD-10-CM | POA: Diagnosis not present

## 2020-12-16 DIAGNOSIS — I129 Hypertensive chronic kidney disease with stage 1 through stage 4 chronic kidney disease, or unspecified chronic kidney disease: Secondary | ICD-10-CM | POA: Diagnosis not present

## 2021-01-01 DIAGNOSIS — R270 Ataxia, unspecified: Secondary | ICD-10-CM | POA: Diagnosis not present

## 2021-01-23 DIAGNOSIS — G319 Degenerative disease of nervous system, unspecified: Secondary | ICD-10-CM | POA: Diagnosis not present

## 2021-01-23 DIAGNOSIS — R270 Ataxia, unspecified: Secondary | ICD-10-CM | POA: Diagnosis not present

## 2021-02-01 DIAGNOSIS — Z23 Encounter for immunization: Secondary | ICD-10-CM | POA: Diagnosis not present

## 2021-02-01 DIAGNOSIS — R079 Chest pain, unspecified: Secondary | ICD-10-CM | POA: Diagnosis not present

## 2021-02-01 DIAGNOSIS — R0602 Shortness of breath: Secondary | ICD-10-CM | POA: Diagnosis not present

## 2021-02-15 DIAGNOSIS — E7849 Other hyperlipidemia: Secondary | ICD-10-CM | POA: Diagnosis not present

## 2021-02-15 DIAGNOSIS — I129 Hypertensive chronic kidney disease with stage 1 through stage 4 chronic kidney disease, or unspecified chronic kidney disease: Secondary | ICD-10-CM | POA: Diagnosis not present

## 2021-02-15 DIAGNOSIS — N183 Chronic kidney disease, stage 3 unspecified: Secondary | ICD-10-CM | POA: Diagnosis not present
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
## 2021-02-21 ENCOUNTER — Ambulatory Visit: Payer: Medicaid Other | Admitting: Family Medicine

## 2021-02-21 DIAGNOSIS — R0789 Other chest pain: Secondary | ICD-10-CM

## 2021-02-21 DIAGNOSIS — R0602 Shortness of breath: Secondary | ICD-10-CM

## 2021-02-21 DIAGNOSIS — I10 Essential (primary) hypertension: Secondary | ICD-10-CM

## 2021-02-21 DIAGNOSIS — Z8673 Personal history of transient ischemic attack (TIA), and cerebral infarction without residual deficits: Secondary | ICD-10-CM

## 2021-02-21 DIAGNOSIS — E782 Mixed hyperlipidemia: Secondary | ICD-10-CM

## 2021-02-21 DIAGNOSIS — I2510 Atherosclerotic heart disease of native coronary artery without angina pectoris: Secondary | ICD-10-CM

## 2021-03-08 DIAGNOSIS — E782 Mixed hyperlipidemia: Secondary | ICD-10-CM | POA: Diagnosis not present

## 2021-03-08 DIAGNOSIS — E7849 Other hyperlipidemia: Secondary | ICD-10-CM | POA: Diagnosis not present

## 2021-03-08 DIAGNOSIS — I10 Essential (primary) hypertension: Secondary | ICD-10-CM | POA: Diagnosis not present

## 2021-03-08 DIAGNOSIS — N183 Chronic kidney disease, stage 3 unspecified: Secondary | ICD-10-CM | POA: Diagnosis not present

## 2021-03-13 DIAGNOSIS — R5383 Other fatigue: Secondary | ICD-10-CM | POA: Diagnosis not present

## 2021-03-13 DIAGNOSIS — N1831 Chronic kidney disease, stage 3a: Secondary | ICD-10-CM | POA: Diagnosis not present

## 2021-03-13 DIAGNOSIS — G309 Alzheimer's disease, unspecified: Secondary | ICD-10-CM | POA: Diagnosis not present

## 2021-03-13 DIAGNOSIS — D511 Vitamin B12 deficiency anemia due to selective vitamin B12 malabsorption with proteinuria: Secondary | ICD-10-CM | POA: Diagnosis not present

## 2021-03-13 DIAGNOSIS — E559 Vitamin D deficiency, unspecified: Secondary | ICD-10-CM | POA: Diagnosis not present

## 2021-03-13 DIAGNOSIS — J4520 Mild intermittent asthma, uncomplicated: Secondary | ICD-10-CM | POA: Diagnosis not present

## 2021-03-13 DIAGNOSIS — I10 Essential (primary) hypertension: Secondary | ICD-10-CM | POA: Diagnosis not present

## 2021-03-13 DIAGNOSIS — E7849 Other hyperlipidemia: Secondary | ICD-10-CM | POA: Diagnosis not present

## 2021-04-17 DIAGNOSIS — I129 Hypertensive chronic kidney disease with stage 1 through stage 4 chronic kidney disease, or unspecified chronic kidney disease: Secondary | ICD-10-CM | POA: Diagnosis not present

## 2021-04-17 DIAGNOSIS — N183 Chronic kidney disease, stage 3 unspecified: Secondary | ICD-10-CM | POA: Diagnosis not present

## 2021-04-17 DIAGNOSIS — E7849 Other hyperlipidemia: Secondary | ICD-10-CM | POA: Diagnosis not present

## 2021-06-07 DIAGNOSIS — N1831 Chronic kidney disease, stage 3a: Secondary | ICD-10-CM | POA: Diagnosis not present

## 2021-06-07 DIAGNOSIS — E7849 Other hyperlipidemia: Secondary | ICD-10-CM | POA: Diagnosis not present

## 2021-06-07 DIAGNOSIS — Z1329 Encounter for screening for other suspected endocrine disorder: Secondary | ICD-10-CM | POA: Diagnosis not present

## 2021-06-07 DIAGNOSIS — R5383 Other fatigue: Secondary | ICD-10-CM | POA: Diagnosis not present

## 2021-06-07 DIAGNOSIS — E162 Hypoglycemia, unspecified: Secondary | ICD-10-CM | POA: Diagnosis not present

## 2021-06-07 DIAGNOSIS — E782 Mixed hyperlipidemia: Secondary | ICD-10-CM | POA: Diagnosis not present

## 2021-06-11 DIAGNOSIS — I10 Essential (primary) hypertension: Secondary | ICD-10-CM | POA: Diagnosis not present

## 2021-06-11 DIAGNOSIS — N1831 Chronic kidney disease, stage 3a: Secondary | ICD-10-CM | POA: Diagnosis not present

## 2021-06-11 DIAGNOSIS — R5383 Other fatigue: Secondary | ICD-10-CM | POA: Diagnosis not present

## 2021-06-11 DIAGNOSIS — J4520 Mild intermittent asthma, uncomplicated: Secondary | ICD-10-CM | POA: Diagnosis not present

## 2021-06-11 DIAGNOSIS — E559 Vitamin D deficiency, unspecified: Secondary | ICD-10-CM | POA: Diagnosis not present

## 2021-06-11 DIAGNOSIS — G309 Alzheimer's disease, unspecified: Secondary | ICD-10-CM | POA: Diagnosis not present

## 2021-06-11 DIAGNOSIS — D511 Vitamin B12 deficiency anemia due to selective vitamin B12 malabsorption with proteinuria: Secondary | ICD-10-CM | POA: Diagnosis not present

## 2021-06-11 DIAGNOSIS — E7849 Other hyperlipidemia: Secondary | ICD-10-CM | POA: Diagnosis not present

## 2021-06-20 ENCOUNTER — Ambulatory Visit: Payer: Medicare Other | Admitting: Cardiology
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
## 2021-07-10 ENCOUNTER — Other Ambulatory Visit: Payer: Self-pay

## 2021-07-10 ENCOUNTER — Ambulatory Visit (INDEPENDENT_AMBULATORY_CARE_PROVIDER_SITE_OTHER): Payer: Medicare Other | Admitting: Cardiovascular Disease

## 2021-07-10 ENCOUNTER — Encounter: Payer: Self-pay | Admitting: Cardiovascular Disease

## 2021-07-10 VITALS — BP 116/78 | HR 61 | Ht 65.0 in | Wt 226.0 lb

## 2021-07-10 DIAGNOSIS — I2510 Atherosclerotic heart disease of native coronary artery without angina pectoris: Secondary | ICD-10-CM

## 2021-07-10 DIAGNOSIS — R55 Syncope and collapse: Secondary | ICD-10-CM | POA: Diagnosis not present

## 2021-07-10 DIAGNOSIS — I272 Pulmonary hypertension, unspecified: Secondary | ICD-10-CM

## 2021-07-10 DIAGNOSIS — E782 Mixed hyperlipidemia: Secondary | ICD-10-CM

## 2021-07-10 DIAGNOSIS — Z8673 Personal history of transient ischemic attack (TIA), and cerebral infarction without residual deficits: Secondary | ICD-10-CM | POA: Diagnosis not present

## 2021-07-10 DIAGNOSIS — I10 Essential (primary) hypertension: Secondary | ICD-10-CM

## 2021-07-10 MED ORDER — METOPROLOL SUCCINATE ER 25 MG PO TB24: 25.0000 mg | ORAL_TABLET | Freq: Every day | ORAL | 3 refills | Status: DC

## 2021-07-10 NOTE — Addendum Note (Signed)
Addended by: Barbarann Ehlers A on: 07/10/2021 04:31 PM   Modules accepted: Orders

## 2021-07-10 NOTE — Addendum Note (Signed)
Addended by: Barbarann Ehlers A on: 07/10/2021 04:30 PM   Modules accepted: Orders

## 2021-07-10 NOTE — Patient Instructions (Signed)
Medication Instructions:   Decrease Toprol XL to 25 mg Daily   *If you need a refill on your cardiac medications before your next appointment, please call your pharmacy*   Lab Work: NONE   If you have labs (blood work) drawn today and your tests are completely normal, you will receive your results only by: [place] (if you have MyChart) OR A paper copy in the mail If you have any lab test that is abnormal or we need to change your treatment, we will call you to review the results.   Testing/Procedures: Your physician has recommended that you wear an event monitor. Event monitors are medical devices that record the hearts electrical activity. Doctors most often [place] these monitors to diagnose arrhythmias. Arrhythmias are problems with the speed or rhythm of the heartbeat. The monitor is a small, portable device. You can wear one while you do your normal daily activities. This is usually used to diagnose what is causing palpitations/syncope (passing out).  Your physician has requested that you have an echocardiogram. Echocardiography is a painless test that uses sound waves to create images of your heart. It provides your doctor with information about the size and shape of your heart and how well your hearts chambers and valves are working. This procedure takes approximately one hour. There are no restrictions for this procedure.    Follow-Up: At [facility], you and your health needs are our priority.  As part of our continuing mission to provide you with exceptional heart care, we have created designated Provider Care Teams.  These Care Teams include your primary Cardiologist (physician) and Advanced Practice Providers (APPs -  Physician Assistants and Nurse Practitioners) who all work together to provide you with the care you need, when you need it.  We recommend signing up for the patient portal called "MyChart".  Sign up information is provided on this After Visit Summary.   MyChart is used to connect with patients for Virtual Visits (Telemedicine).  Patients are able to view lab/test results, encounter notes, upcoming appointments, etc.  Non-urgent messages can be sent to your provider as well.   To learn more about what you can do with MyChart, go to [email or web address].    Your next appointment:   1 year(s)  The format for your next appointment:   In Person  Provider:   Jenkins Rouge, MD    Other Instructions Thank you for choosing [facility]!

## 2021-07-16 DIAGNOSIS — I129 Hypertensive chronic kidney disease with stage 1 through stage 4 chronic kidney disease, or unspecified chronic kidney disease: Secondary | ICD-10-CM | POA: Diagnosis not present

## 2021-07-16 DIAGNOSIS — E7849 Other hyperlipidemia: Secondary | ICD-10-CM | POA: Diagnosis not present

## 2021-07-16 DIAGNOSIS — N183 Chronic kidney disease, stage 3 unspecified: Secondary | ICD-10-CM | POA: Diagnosis not present

## 2021-07-23 ENCOUNTER — Ambulatory Visit (INDEPENDENT_AMBULATORY_CARE_PROVIDER_SITE_OTHER): Payer: Medicare Other

## 2021-07-23 DIAGNOSIS — R55 Syncope and collapse: Secondary | ICD-10-CM | POA: Diagnosis not present

## 2021-07-25 ENCOUNTER — Other Ambulatory Visit: Payer: Self-pay

## 2021-07-25 NOTE — Progress Notes (Unsigned)
30 Day event monitor for syncope  ?

## 2021-08-05 ENCOUNTER — Ambulatory Visit (HOSPITAL_COMMUNITY): Admission: RE | Admit: 2021-08-05 | Payer: Medicare Other | Source: Ambulatory Visit

## 2021-08-15 ENCOUNTER — Telehealth: Payer: Self-pay | Admitting: Cardiovascular Disease

## 2021-08-15 NOTE — Telephone Encounter (Signed)
Spoke with pt and informed that she could remove monitor on August 21, 2021.  ?

## 2021-08-15 NOTE — Telephone Encounter (Signed)
Pt calling to see if she is able to take heart monitor off. She states that it's been on for almost 2 weeks ?

## 2021-08-21 DIAGNOSIS — H43813 Vitreous degeneration, bilateral: Secondary | ICD-10-CM | POA: Diagnosis not present

## 2021-11-07 DIAGNOSIS — I499 Cardiac arrhythmia, unspecified: Secondary | ICD-10-CM | POA: Diagnosis not present

## 2021-11-07 DIAGNOSIS — I2510 Atherosclerotic heart disease of native coronary artery without angina pectoris: Secondary | ICD-10-CM | POA: Diagnosis not present

## 2021-11-07 DIAGNOSIS — I700 Atherosclerosis of aorta: Secondary | ICD-10-CM | POA: Diagnosis not present

## 2021-11-07 DIAGNOSIS — N289 Disorder of kidney and ureter, unspecified: Secondary | ICD-10-CM | POA: Diagnosis not present

## 2021-11-07 DIAGNOSIS — R6889 Other general symptoms and signs: Secondary | ICD-10-CM | POA: Diagnosis not present

## 2021-11-07 DIAGNOSIS — Z20822 Contact with and (suspected) exposure to covid-19: Secondary | ICD-10-CM | POA: Diagnosis not present

## 2021-11-07 DIAGNOSIS — Z87891 Personal history of nicotine dependence: Secondary | ICD-10-CM | POA: Diagnosis not present

## 2021-11-07 DIAGNOSIS — I10 Essential (primary) hypertension: Secondary | ICD-10-CM | POA: Diagnosis not present

## 2021-11-07 DIAGNOSIS — K920 Hematemesis: Secondary | ICD-10-CM | POA: Diagnosis not present

## 2021-11-07 DIAGNOSIS — E785 Hyperlipidemia, unspecified: Secondary | ICD-10-CM | POA: Diagnosis not present

## 2021-11-07 DIAGNOSIS — R9341 Abnormal radiologic findings on diagnostic imaging of renal pelvis, ureter, or bladder: Secondary | ICD-10-CM | POA: Diagnosis not present

## 2021-11-07 DIAGNOSIS — Z743 Need for continuous supervision: Secondary | ICD-10-CM | POA: Diagnosis not present

## 2021-11-07 DIAGNOSIS — R935 Abnormal findings on diagnostic imaging of other abdominal regions, including retroperitoneum: Secondary | ICD-10-CM | POA: Diagnosis not present

## 2021-11-07 DIAGNOSIS — R58 Hemorrhage, not elsewhere classified: Secondary | ICD-10-CM | POA: Diagnosis not present

## 2021-11-07 DIAGNOSIS — R109 Unspecified abdominal pain: Secondary | ICD-10-CM | POA: Diagnosis not present

## 2021-11-08 ENCOUNTER — Inpatient Hospital Stay (HOSPITAL_COMMUNITY)
Admission: AD | Admit: 2021-11-08 | Payer: Medicare Other | Source: Other Acute Inpatient Hospital | Admitting: Internal Medicine

## 2021-11-08 DIAGNOSIS — D62 Acute posthemorrhagic anemia: Secondary | ICD-10-CM | POA: Diagnosis not present

## 2021-11-08 DIAGNOSIS — Z8673 Personal history of transient ischemic attack (TIA), and cerebral infarction without residual deficits: Secondary | ICD-10-CM | POA: Diagnosis not present

## 2021-11-08 DIAGNOSIS — R7989 Other specified abnormal findings of blood chemistry: Secondary | ICD-10-CM | POA: Diagnosis not present

## 2021-11-08 DIAGNOSIS — J449 Chronic obstructive pulmonary disease, unspecified: Secondary | ICD-10-CM | POA: Diagnosis not present

## 2021-11-08 DIAGNOSIS — R0602 Shortness of breath: Secondary | ICD-10-CM | POA: Diagnosis not present

## 2021-11-08 DIAGNOSIS — R112 Nausea with vomiting, unspecified: Secondary | ICD-10-CM | POA: Diagnosis not present

## 2021-11-08 DIAGNOSIS — I2510 Atherosclerotic heart disease of native coronary artery without angina pectoris: Secondary | ICD-10-CM | POA: Diagnosis not present

## 2021-11-08 DIAGNOSIS — Z20822 Contact with and (suspected) exposure to covid-19: Secondary | ICD-10-CM | POA: Diagnosis not present

## 2021-11-08 DIAGNOSIS — G4730 Sleep apnea, unspecified: Secondary | ICD-10-CM | POA: Diagnosis not present

## 2021-11-08 DIAGNOSIS — K920 Hematemesis: Secondary | ICD-10-CM | POA: Diagnosis not present

## 2021-11-08 DIAGNOSIS — Z955 Presence of coronary angioplasty implant and graft: Secondary | ICD-10-CM | POA: Diagnosis not present

## 2021-11-08 DIAGNOSIS — Z95818 Presence of other cardiac implants and grafts: Secondary | ICD-10-CM | POA: Diagnosis not present

## 2021-11-08 DIAGNOSIS — R935 Abnormal findings on diagnostic imaging of other abdominal regions, including retroperitoneum: Secondary | ICD-10-CM | POA: Diagnosis not present

## 2021-11-08 DIAGNOSIS — R1010 Upper abdominal pain, unspecified: Secondary | ICD-10-CM | POA: Diagnosis not present

## 2021-11-08 DIAGNOSIS — K922 Gastrointestinal hemorrhage, unspecified: Secondary | ICD-10-CM | POA: Diagnosis not present

## 2021-11-08 DIAGNOSIS — R54 Age-related physical debility: Secondary | ICD-10-CM | POA: Diagnosis not present

## 2021-11-08 DIAGNOSIS — Z9884 Bariatric surgery status: Secondary | ICD-10-CM | POA: Diagnosis not present

## 2021-11-08 DIAGNOSIS — K589 Irritable bowel syndrome without diarrhea: Secondary | ICD-10-CM | POA: Diagnosis not present

## 2021-11-08 DIAGNOSIS — Z7901 Long term (current) use of anticoagulants: Secondary | ICD-10-CM | POA: Diagnosis not present

## 2021-11-08 DIAGNOSIS — E785 Hyperlipidemia, unspecified: Secondary | ICD-10-CM | POA: Diagnosis not present

## 2021-11-08 DIAGNOSIS — I10 Essential (primary) hypertension: Secondary | ICD-10-CM | POA: Diagnosis not present

## 2021-11-08 DIAGNOSIS — G4733 Obstructive sleep apnea (adult) (pediatric): Secondary | ICD-10-CM | POA: Diagnosis not present

## 2021-11-08 DIAGNOSIS — I700 Atherosclerosis of aorta: Secondary | ICD-10-CM | POA: Diagnosis not present

## 2021-11-08 DIAGNOSIS — R109 Unspecified abdominal pain: Secondary | ICD-10-CM | POA: Diagnosis not present

## 2021-11-08 DIAGNOSIS — Z87891 Personal history of nicotine dependence: Secondary | ICD-10-CM | POA: Diagnosis not present

## 2021-11-08 DIAGNOSIS — R933 Abnormal findings on diagnostic imaging of other parts of digestive tract: Secondary | ICD-10-CM | POA: Diagnosis not present

## 2021-11-08 DIAGNOSIS — Z8674 Personal history of sudden cardiac arrest: Secondary | ICD-10-CM | POA: Diagnosis not present

## 2021-11-08 DIAGNOSIS — J45909 Unspecified asthma, uncomplicated: Secondary | ICD-10-CM | POA: Diagnosis not present

## 2021-11-08 DIAGNOSIS — Z7902 Long term (current) use of antithrombotics/antiplatelets: Secondary | ICD-10-CM | POA: Diagnosis not present

## 2021-11-08 DIAGNOSIS — N289 Disorder of kidney and ureter, unspecified: Secondary | ICD-10-CM | POA: Diagnosis not present

## 2021-11-22 DIAGNOSIS — D649 Anemia, unspecified: Secondary | ICD-10-CM | POA: Diagnosis not present

## 2021-11-22 DIAGNOSIS — K2970 Gastritis, unspecified, without bleeding: Secondary | ICD-10-CM | POA: Diagnosis not present

## 2021-11-22 DIAGNOSIS — K922 Gastrointestinal hemorrhage, unspecified: Secondary | ICD-10-CM | POA: Diagnosis not present

## 2021-12-04 ENCOUNTER — Ambulatory Visit (HOSPITAL_COMMUNITY)
Admission: RE | Admit: 2021-12-04 | Discharge: 2021-12-04 | Disposition: A | Discharge status: Home / Self Care | Payer: Medicare Other | Source: Ambulatory Visit | Attending: Cardiovascular Disease | Admitting: Cardiovascular Disease

## 2021-12-04 DIAGNOSIS — I272 Pulmonary hypertension, unspecified: Secondary | ICD-10-CM

## 2021-12-04 LAB — ECHOCARDIOGRAM COMPLETE
AR max vel: 1.8 cm2
AV Area VTI: 1.93 cm2
AV Area mean vel: 2 cm2
AV Mean grad: 13 mmHg
AV Peak grad: 21.8 mmHg
Ao pk vel: 2.33 m/s
Area-P 1/2: 2.07 cm2
S' Lateral: 2.9 cm

## 2021-12-04 NOTE — Progress Notes (Signed)
*  PRELIMINARY RESULTS* Echocardiogram 2D Echocardiogram has been performed.  Samuel Germany 12/04/2021, 2:03 PM

## 2021-12-12 DIAGNOSIS — K2970 Gastritis, unspecified, without bleeding: Secondary | ICD-10-CM | POA: Diagnosis not present

## 2021-12-12 DIAGNOSIS — D649 Anemia, unspecified: Secondary | ICD-10-CM | POA: Diagnosis not present

## 2021-12-12 DIAGNOSIS — K922 Gastrointestinal hemorrhage, unspecified: Secondary | ICD-10-CM | POA: Diagnosis not present

## 2021-12-18 DIAGNOSIS — H43813 Vitreous degeneration, bilateral: Secondary | ICD-10-CM | POA: Diagnosis not present

## 2021-12-18 DIAGNOSIS — H02834 Dermatochalasis of left upper eyelid: Secondary | ICD-10-CM | POA: Diagnosis not present

## 2021-12-18 DIAGNOSIS — H02831 Dermatochalasis of right upper eyelid: Secondary | ICD-10-CM | POA: Diagnosis not present

## 2022-01-22 DIAGNOSIS — R531 Weakness: Secondary | ICD-10-CM | POA: Diagnosis not present

## 2022-01-24 DIAGNOSIS — R5383 Other fatigue: Secondary | ICD-10-CM | POA: Diagnosis not present

## 2022-01-24 DIAGNOSIS — D649 Anemia, unspecified: Secondary | ICD-10-CM | POA: Diagnosis not present

## 2022-01-30 DIAGNOSIS — D649 Anemia, unspecified: Secondary | ICD-10-CM | POA: Diagnosis not present

## 2022-02-23 NOTE — Progress Notes (Unsigned)
GI Office Note    Referring Provider: Caryl Bis, MD Primary Care Physician:  Caryl Bis, MD  Primary Gastroenterologist:  Chief Complaint   No chief complaint on file.    History of Present Illness   Tara Love is a 81 y.o. female presenting today at the request of *** for EGD/colonoscopy. 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 EGD 10/2021 while inpatient revealed normal post-RNY bypass anatomy.   Colonoscopy 08/2019: Path: tubular adenoma removed from ICV. Tubular adenoma removed from 25cm and bx of same residual polyp. Plans for repeat colonoscopy in 3 years.      Medications   Current Outpatient Medications  Medication Sig Dispense Refill   acetaminophen (TYLENOL) 500 MG tablet Take 1,000-1,500 mg by mouth 2 (two) times daily as needed for moderate pain or headache.      albuterol (VENTOLIN HFA) 108 (90 Base) MCG/ACT inhaler Inhale 2 puffs into the lungs every 4 (four) hours as needed.     Cholecalciferol (DIALYVITE VITAMIN D 5000) 125 MCG (5000 UT) capsule Take 5,000 Units by mouth daily.     clopidogrel (PLAVIX) 75 MG tablet Take 1 tablet (75 mg total) by mouth daily.     Cyanocobalamin (B-12) 2500 MCG TABS Take 2,500 mcg by mouth daily.     gabapentin (NEURONTIN) 100 MG capsule Take 1 capsule at bedtime for a week, then increase to 2 capsules at bedtime (Patient taking differently: Take 100 mg by mouth at bedtime.) 90 capsule 0   isosorbide mononitrate (IMDUR) 60 MG 24 hr tablet Take 1 tablet (60 mg total) by mouth daily. 30 tablet 6   metoprolol succinate (TOPROL XL) 25 MG 24 hr tablet Take 1 tablet (25 mg total) by mouth daily. 90 tablet 3   nitroGLYCERIN (NITROSTAT) 0.4 MG SL tablet Place 1 tablet (0.4 mg total) under the tongue every 5 (five) minutes as needed for chest pain  (up to 3 doses).     nortriptyline (PAMELOR) 10 MG capsule Take 10 mg by mouth at bedtime.     ranolazine (RANEXA) 1000 MG SR tablet TAKE 1 TABLET BY MOUTH TWICE DAILY (Patient taking differently: Take 1,000 mg by mouth 2 (two) times daily.) 60 tablet 6   simvastatin (ZOCOR) 40 MG tablet Take 40 mg by mouth daily.      No current facility-administered medications for this visit.    Allergies   Allergies as of 02/24/2022 - Review Complete 07/10/2021  Allergen Reaction Noted   Codeine phosphate Nausea Only 03/22/2008   Morphine sulfate Nausea Only 03/22/2008    Past Medical History   Past Medical History:  Diagnosis Date   Asthma    CAD (coronary artery disease)    a. DES to diag 06/2007. b. Cath 01/2013 due to abnormal nuc at [facility]: nonobst dz, patent stent, EF 55-65%.   Iron deficiency anemia, unspecified    Irritable bowel syndrome    Morbid obesity ([place])    Other and unspecified hyperlipidemia    Sleep apnea    Unspecified asthma(493.90)    Unspecified essential hypertension     Past Surgical History   Past Surgical History:  Procedure Laterality Date   BIOPSY  10/18/2019   Procedure: BIOPSY;  Surgeon: Alphonsa Overall, MD;  Location: [facility];  Service: General;;  CARPAL TUNNEL RELEASE     Bilateral   CATARACT EXTRACTION W/PHACO Left 07/14/2016   Procedure: CATARACT EXTRACTION PHACO AND INTRAOCULAR LENS PLACEMENT (IOC);  Surgeon: Tonny Branch, MD;  Location: [facility];  Service: Ophthalmology;  Laterality: Left;  CDE: 7.04   CATARACT EXTRACTION W/PHACO Right 08/11/2016   Procedure: CATARACT EXTRACTION PHACO AND INTRAOCULAR LENS PLACEMENT (IOC);  Surgeon: Tonny Branch, MD;  Location: [facility];  Service: Ophthalmology;  Laterality: Right;  CDE: 11.58   CHOLECYSTECTOMY OPEN  1987   DILATION AND CURETTAGE OF UTERUS     ESOPHAGOGASTRODUODENOSCOPY (EGD) WITH PROPOFOL N/A 10/18/2019   Procedure: ESOPHAGOGASTRODUODENOSCOPY (EGD) WITH PROPOFOL;  Surgeon: Alphonsa Overall, MD;  Location: [facility];  Service: General;  Laterality: N/A;   GASTRIC BYPASS SURGERY  1993   Subtotal gastrectomy   [place]  01/25/2003   with mesh   LEFT HEART CATHETERIZATION WITH CORONARY ANGIOGRAM N/A 02/15/2013   Procedure: LEFT HEART CATHETERIZATION WITH CORONARY ANGIOGRAM;  Surgeon: Peter M Martinique, MD;  Location: [facility] CATH LAB;  Service: Cardiovascular;  Laterality: N/A;   TONSILLECTOMY     as a child    Past Family History   Family History  Problem Relation Age of Onset   Hernia Mother 67   Cancer Father        Prostate & Hx of CHF    Past Social History   Social History   Socioeconomic History   Marital status: Divorced    Spouse name: Not on file   Number of children: 2   Years of education: Not on file   Highest education level: Not on file  Occupational History   Occupation: RETIRED  Tobacco Use   Smoking status: Former    Packs/day: 2.00    Years: 18.00    Total pack years: 36.00    Types: Cigarettes    Start date: 05/20/1947    Quit date: 05/19/1965    Years since quitting: 56.8   Smokeless tobacco: Former    Types: Snuff, Chew    Quit date: 05/19/1946   Tobacco comments:    Started smoking at age 6-has not smoked in 60 years/dipped snuff and chewed tobacco during childhood  Vaping Use   Vaping Use: Never used  Substance and Sexual Activity   Alcohol use: No    Alcohol/week: 0.0 standard drinks of alcohol   Drug use: No   Sexual activity: Not on file  Other Topics Concern   Not on file  Social History Narrative   Right handed   One story home   Drinks caffeine, usually diet coke or tea   Social Determinants of Health   Financial Resource Strain: Not on file  Food Insecurity: Not on file  Transportation Needs: Not on file  Physical Activity: Not on file  Stress: Not on file  Social Connections: Not on file  Intimate Partner Violence: Not on file    Review of Systems   General: Negative for anorexia, weight loss, fever, chills, fatigue,  weakness. Eyes: Negative for vision changes.  ENT: Negative for hoarseness, difficulty swallowing , nasal congestion. CV: Negative for chest pain, angina, palpitations, dyspnea on exertion, peripheral edema.  Respiratory: Negative for dyspnea at rest, dyspnea on exertion, cough, sputum, wheezing.  GI: See history of present illness. GU:  Negative for dysuria, hematuria, urinary incontinence, urinary frequency, nocturnal urination.  MS: Negative for joint pain, low back pain.  Derm: Negative for rash or itching.  Neuro: Negative for weakness, abnormal sensation, seizure, frequent  headaches, memory loss,  confusion.  Psych: Negative for anxiety, depression, suicidal ideation, hallucinations.  Endo: Negative for unusual weight change.  Heme: Negative for bruising or bleeding. Allergy: Negative for rash or hives.  Physical Exam   There were no vitals taken for this visit.   General: Well-nourished, well-developed in no acute distress.  Head: Normocephalic, atraumatic.   Eyes: Conjunctiva pink, no icterus. Mouth: Oropharyngeal mucosa moist and pink , no lesions erythema or exudate. Neck: Supple without thyromegaly, masses, or lymphadenopathy.  Lungs: Clear to auscultation bilaterally.  Heart: Regular rate and rhythm, no murmurs rubs or gallops.  Abdomen: Bowel sounds are normal, nontender, nondistended, no hepatosplenomegaly or masses,  no abdominal bruits or hernia, no rebound or guarding.   Rectal: *** Extremities: No lower extremity edema. No clubbing or deformities.  Neuro: Alert and oriented x 4 , grossly normal neurologically.  Skin: Warm and dry, no rash or jaundice.   Psych: Alert and cooperative, normal mood and affect.  Labs   *** Imaging Studies   No results found.  Assessment       PLAN   ***   Laureen Ochs. Bobby Rumpf, [place], [facility]

## 2022-02-24 ENCOUNTER — Telehealth: Payer: Self-pay | Admitting: *Deleted

## 2022-02-24 ENCOUNTER — Ambulatory Visit (INDEPENDENT_AMBULATORY_CARE_PROVIDER_SITE_OTHER): Payer: Medicare Other | Admitting: Gastroenterology

## 2022-02-24 ENCOUNTER — Telehealth: Payer: Self-pay

## 2022-02-24 ENCOUNTER — Encounter: Payer: Self-pay | Admitting: Gastroenterology

## 2022-02-24 VITALS — BP 128/76 | HR 85 | Temp 97.5°F | Ht 65.5 in | Wt 222.8 lb

## 2022-02-24 DIAGNOSIS — D509 Iron deficiency anemia, unspecified: Secondary | ICD-10-CM | POA: Insufficient documentation

## 2022-02-24 DIAGNOSIS — Z9884 Bariatric surgery status: Secondary | ICD-10-CM | POA: Diagnosis not present

## 2022-02-24 DIAGNOSIS — S3632XD Contusion of stomach, subsequent encounter: Secondary | ICD-10-CM

## 2022-02-24 DIAGNOSIS — S3632XA Contusion of stomach, initial encounter: Secondary | ICD-10-CM | POA: Insufficient documentation

## 2022-02-24 NOTE — Patient Instructions (Signed)
Please call with medication list today.  We will schedule you for a follow up CT scan of your stomach to make sure the large hematoma has resolved. If all looks good, then we will consider colonoscopy and upper endoscopy. Please continue oral iron.

## 2022-02-24 NOTE — Telephone Encounter (Signed)
[facility] AT [phone]  Pt's CT is scheduled for 03/18/22, needs to arrive at 10 am. Nothing to eat or drink 4 hours prior to procedure.

## 2022-02-24 NOTE — Telephone Encounter (Signed)
Granddaughter called with updated med list.

## 2022-02-24 NOTE — Telephone Encounter (Signed)
Noted. Ov note updated.

## 2022-02-24 NOTE — Telephone Encounter (Signed)
Pt informed of appt date and time.  °

## 2022-02-24 NOTE — Telephone Encounter (Signed)
Description: CT ABDOMEN & PELVIS W/ Case Number: [id number] Review Date: 02/24/2022 11:35:47 AM Expiration Date: N/A Status: This member's benefit plan did not require a prior authorization for this request.

## 2022-02-24 NOTE — Progress Notes (Signed)
Medication list updated.

## 2022-03-18 ENCOUNTER — Ambulatory Visit (HOSPITAL_COMMUNITY)
Admission: RE | Admit: 2022-03-18 | Discharge: 2022-03-18 | Disposition: A | Discharge status: Home / Self Care | Payer: Medicare Other | Source: Ambulatory Visit | Attending: Gastroenterology | Admitting: Gastroenterology

## 2022-03-18 DIAGNOSIS — S3632XD Contusion of stomach, subsequent encounter: Secondary | ICD-10-CM | POA: Diagnosis not present

## 2022-03-18 DIAGNOSIS — Z9884 Bariatric surgery status: Secondary | ICD-10-CM | POA: Insufficient documentation

## 2022-03-18 DIAGNOSIS — D509 Iron deficiency anemia, unspecified: Secondary | ICD-10-CM | POA: Insufficient documentation

## 2022-03-18 DIAGNOSIS — I700 Atherosclerosis of aorta: Secondary | ICD-10-CM | POA: Diagnosis not present

## 2022-03-18 DIAGNOSIS — R109 Unspecified abdominal pain: Secondary | ICD-10-CM | POA: Diagnosis not present

## 2022-03-18 MED ORDER — IOHEXOL 9 MG/ML PO SOLN
ORAL | Status: AC
  Filled 2022-03-18: qty 1000

## 2022-03-18 MED ORDER — IOHEXOL 300 MG/ML  SOLN
100.0000 mL | Freq: Once | INTRAMUSCULAR | Status: AC | PRN
  Administered 2022-03-18: 100 mL via INTRAVENOUS

## 2022-03-27 ENCOUNTER — Telehealth: Payer: Self-pay

## 2022-03-27 NOTE — Telephone Encounter (Signed)
See result note.  

## 2022-03-27 NOTE — Telephone Encounter (Signed)
Pt's daughter called wanting to know the results to the pt's recent imaging. Informed pt's daughter that the provider has ten business days to get them the results. Daughter verbalized understanding.

## 2022-03-28 DIAGNOSIS — I10 Essential (primary) hypertension: Secondary | ICD-10-CM | POA: Diagnosis not present

## 2022-03-28 DIAGNOSIS — E7849 Other hyperlipidemia: Secondary | ICD-10-CM | POA: Diagnosis not present

## 2022-03-28 DIAGNOSIS — Z1329 Encounter for screening for other suspected endocrine disorder: Secondary | ICD-10-CM | POA: Diagnosis not present

## 2022-03-28 DIAGNOSIS — R5383 Other fatigue: Secondary | ICD-10-CM | POA: Diagnosis not present

## 2022-03-28 DIAGNOSIS — N1831 Chronic kidney disease, stage 3a: Secondary | ICD-10-CM | POA: Diagnosis not present

## 2022-03-31 ENCOUNTER — Encounter: Payer: Self-pay | Admitting: *Deleted

## 2022-03-31 ENCOUNTER — Telehealth: Payer: Self-pay | Admitting: *Deleted

## 2022-03-31 MED ORDER — PEG 3350-KCL-NA BICARB-NACL 420 G PO SOLR: 4000.0000 mL | Freq: Once | ORAL | 0 refills | Status: AC

## 2022-03-31 NOTE — Telephone Encounter (Signed)
[employer] PA:  APPROVED Authorization #: [id number]  DOS: 04/21/22-05/18/22

## 2022-04-14 DIAGNOSIS — E7849 Other hyperlipidemia: Secondary | ICD-10-CM | POA: Diagnosis not present

## 2022-04-14 DIAGNOSIS — D511 Vitamin B12 deficiency anemia due to selective vitamin B12 malabsorption with proteinuria: Secondary | ICD-10-CM | POA: Diagnosis not present

## 2022-04-14 DIAGNOSIS — Z1389 Encounter for screening for other disorder: Secondary | ICD-10-CM | POA: Diagnosis not present

## 2022-04-14 DIAGNOSIS — D649 Anemia, unspecified: Secondary | ICD-10-CM | POA: Diagnosis not present

## 2022-04-14 DIAGNOSIS — I10 Essential (primary) hypertension: Secondary | ICD-10-CM | POA: Diagnosis not present

## 2022-04-14 DIAGNOSIS — G309 Alzheimer's disease, unspecified: Secondary | ICD-10-CM | POA: Diagnosis not present

## 2022-04-14 DIAGNOSIS — Z0001 Encounter for general adult medical examination with abnormal findings: Secondary | ICD-10-CM | POA: Diagnosis not present

## 2022-04-14 DIAGNOSIS — J4520 Mild intermittent asthma, uncomplicated: Secondary | ICD-10-CM | POA: Diagnosis not present

## 2022-04-14 DIAGNOSIS — R5383 Other fatigue: Secondary | ICD-10-CM | POA: Diagnosis not present

## 2022-04-14 DIAGNOSIS — E559 Vitamin D deficiency, unspecified: Secondary | ICD-10-CM | POA: Diagnosis not present

## 2022-04-14 DIAGNOSIS — Z23 Encounter for immunization: Secondary | ICD-10-CM | POA: Diagnosis not present

## 2022-04-15 NOTE — Patient Instructions (Signed)
Tara Love  04/15/2022     '@PREFPERIOPPHARMACY'$ @   Your procedure is scheduled on  04/21/2022.   Report to [facility] at  0900  A.M.   Call this number if you have problems the morning of surgery:  [phone]  If you experience any cold or flu symptoms such as cough, fever, chills, shortness of breath, etc. between now and your scheduled surgery, please notify us at the above number.   Remember:  Follow the diet and prep instructions given to you by the office.     Take these medicines the morning of surgery with A SIP OF WATER                        imdur, metoprolol, ranexa.     Do not wear jewelry, make-up or nail polish.  Do not wear lotions, powders, or perfumes, or deodorant.  Do not shave 48 hours prior to surgery.  Men may shave face and neck.  Do not bring valuables to the hospital.  [facility] is not responsible for any belongings or valuables.  Contacts, dentures or bridgework may not be worn into surgery.  Leave your suitcase in the car.  After surgery it may be brought to your room.  For patients admitted to the hospital, discharge time will be determined by your treatment team.  Patients discharged the day of surgery will not be allowed to drive home and must have someone with them for 24 hours.    Special instructions:   DO NOT smoke tobacco or vape for 24 hours  before your procedure.  Please read over the following fact sheets that you were given. Anesthesia Post-op Instructions and Care and Recovery After Surgery      Upper Endoscopy, Adult, Care After After the procedure, it is common to have a sore throat. It is also common to have: Mild stomach pain or discomfort. Bloating. Nausea. Follow these instructions at home: The instructions below may help you care for yourself at home. Your health care provider may give you more instructions. If you have questions, ask your health care provider. If you were given a sedative during  the procedure, it can affect you for several hours. Do not drive or operate machinery until your health care provider says that it is safe. If you will be going home right after the procedure, plan to have a responsible adult: Take you home from the hospital or clinic. You will not be allowed to drive. Care for you for the time you are told. Follow instructions from your health care provider about what you may eat and drink. Return to your normal activities as told by your health care provider. Ask your health care provider what activities are safe for you. Take over-the-counter and prescription medicines only as told by your health care provider. Contact a health care provider if you: Have a sore throat that lasts longer than one day. Have trouble swallowing. Have a fever. Get help right away if you: Vomit blood or your vomit looks like coffee grounds. Have bloody, black, or tarry stools. Have a very bad sore throat or you cannot swallow. Have difficulty breathing or very bad pain in your chest or abdomen. These symptoms may be an emergency. Get help right away. Call 911. Do not wait to see if the symptoms will go away. Do not drive yourself to the hospital. Summary After the procedure, it is  common to have a sore throat, mild stomach discomfort, bloating, and nausea. If you were given a sedative during the procedure, it can affect you for several hours. Do not drive until your health care provider says that it is safe. Follow instructions from your health care provider about what you may eat and drink. Return to your normal activities as told by your health care provider. This information is not intended to replace advice given to you by your health care provider. Make sure you discuss any questions you have with your health care provider. Document Revised: 08/14/2021 Document Reviewed: 08/14/2021 Elsevier Patient Education  [place]. Colonoscopy, Adult, Care After The  following information offers guidance on how to care for yourself after your procedure. Your health care provider may also give you more specific instructions. If you have problems or questions, contact your health care provider. What can I expect after the procedure? After the procedure, it is common to have: A small amount of blood in your stool for 24 hours after the procedure. Some gas. Mild cramping or bloating of your abdomen. Follow these instructions at home: Eating and drinking  Drink enough fluid to keep your urine pale yellow. Follow instructions from your health care provider about eating or drinking restrictions. Resume your normal diet as told by your health care provider. Avoid heavy or fried foods that are hard to digest. Activity Rest as told by your health care provider. Avoid sitting for a long time without moving. Get up to take short walks every 1-2 hours. This is important to improve blood flow and breathing. Ask for help if you feel weak or unsteady. Return to your normal activities as told by your health care provider. Ask your health care provider what activities are safe for you. Managing cramping and bloating  Try walking around when you have cramps or feel bloated. If directed, apply heat to your abdomen as told by your health care provider. Use the heat source that your health care provider recommends, such as a moist heat pack or a heating pad. Place a towel between your skin and the heat source. Leave the heat on for 20-30 minutes. Remove the heat if your skin turns bright red. This is especially important if you are unable to feel pain, heat, or cold. You have a greater risk of getting burned. General instructions If you were given a sedative during the procedure, it can affect you for several hours. Do not drive or operate machinery until your health care provider says that it is safe. For the first 24 hours after the procedure: Do not sign important  documents. Do not drink alcohol. Do your regular daily activities at a slower pace than normal. Eat soft foods that are easy to digest. Take over-the-counter and prescription medicines only as told by your health care provider. Keep all follow-up visits. This is important. Contact a health care provider if: You have blood in your stool 2-3 days after the procedure. Get help right away if: You have more than a small spotting of blood in your stool. You have large blood clots in your stool. You have swelling of your abdomen. You have nausea or vomiting. You have a fever. You have increasing pain in your abdomen that is not relieved with medicine. These symptoms may be an emergency. Get help right away. Call 911. Do not wait to see if the symptoms will go away. Do not drive yourself to the hospital. Summary After the procedure, it is  common to have a small amount of blood in your stool. You may also have mild cramping and bloating of your abdomen. If you were given a sedative during the procedure, it can affect you for several hours. Do not drive or operate machinery until your health care provider says that it is safe. Get help right away if you have a lot of blood in your stool, nausea or vomiting, a fever, or increased pain in your abdomen. This information is not intended to replace advice given to you by your health care provider. Make sure you discuss any questions you have with your health care provider. Document Revised: 12/26/2020 Document Reviewed: 12/26/2020 Elsevier Patient Education  [place] After The following information offers guidance on how to care for yourself after your procedure. Your health care provider may also give you more specific instructions. If you have problems or questions, contact your health care provider. What can I expect after the procedure? After the procedure, it is common to have: Tiredness. Little or no  memory about what happened during or after the procedure. Impaired judgment when it comes to making decisions. Nausea or vomiting. Some trouble with balance. Follow these instructions at home: For the time period you were told by your health care provider:  Rest. Do not participate in activities where you could fall or become injured. Do not drive or use machinery. Do not drink alcohol. Do not take sleeping pills or medicines that cause drowsiness. Do not make important decisions or sign legal documents. Do not take care of children on your own. Medicines Take over-the-counter and prescription medicines only as told by your health care provider. If you were prescribed antibiotics, take them as told by your health care provider. Do not stop using the antibiotic even if you start to feel better. Eating and drinking Follow instructions from your health care provider about what you may eat and drink. Drink enough fluid to keep your urine pale yellow. If you vomit: Drink clear fluids slowly and in small amounts as you are able. Clear fluids include water, ice chips, low-calorie sports drinks, and fruit juice that has water added to it (diluted fruit juice). Eat light and bland foods in small amounts as you are able. These foods include bananas, applesauce, rice, lean meats, toast, and crackers. General instructions  Have a responsible adult stay with you for the time you are told. It is important to have someone help care for you until you are awake and alert. If you have sleep apnea, surgery and some medicines can increase your risk for breathing problems. Follow instructions from your health care provider about wearing your sleep device: When you are sleeping. This includes during daytime naps. While taking prescription pain medicines, sleeping medicines, or medicines that make you drowsy. Do not use any products that contain nicotine or tobacco. These products include cigarettes, chewing  tobacco, and vaping devices, such as e-cigarettes. If you need help quitting, ask your health care provider. Contact a health care provider if: You feel nauseous or vomit every time you eat or drink. You feel light-headed. You are still sleepy or having trouble with balance after 24 hours. You get a rash. You have a fever. You have redness or swelling around the IV site. Get help right away if: You have trouble breathing. You have new confusion after you get home. These symptoms may be an emergency. Get help right away. Call 911. Do not wait to see if  the symptoms will go away. Do not drive yourself to the hospital. This information is not intended to replace advice given to you by your health care provider. Make sure you discuss any questions you have with your health care provider. Document Revised: 09/30/2021 Document Reviewed: 09/30/2021 Elsevier Patient Education  [place].

## 2022-04-16 ENCOUNTER — Encounter (HOSPITAL_COMMUNITY)
Admission: RE | Admit: 2022-04-16 | Discharge: 2022-04-16 | Disposition: A | Discharge status: Home / Self Care | Payer: Medicare Other | Source: Ambulatory Visit | Attending: Internal Medicine | Admitting: Internal Medicine

## 2022-04-16 ENCOUNTER — Encounter (HOSPITAL_COMMUNITY): Payer: Self-pay

## 2022-04-16 VITALS — BP 133/69 | HR 88 | Temp 98.4°F | Resp 18 | Ht 65.5 in | Wt 222.9 lb

## 2022-04-16 DIAGNOSIS — Z01812 Encounter for preprocedural laboratory examination: Secondary | ICD-10-CM | POA: Insufficient documentation

## 2022-04-16 DIAGNOSIS — D509 Iron deficiency anemia, unspecified: Secondary | ICD-10-CM | POA: Diagnosis not present

## 2022-04-16 HISTORY — DX: Other complications of anesthesia, initial encounter: T88.59XA

## 2022-04-16 HISTORY — DX: Other specified postprocedural states: Z98.890

## 2022-04-16 LAB — CBC WITH DIFFERENTIAL/PLATELET
Abs Immature Granulocytes: 0.01 10*3/uL (ref 0.00–0.07)
Basophils Absolute: 0.1 10*3/uL (ref 0.0–0.1)
Basophils Relative: 1 %
Eosinophils Absolute: 0.1 10*3/uL (ref 0.0–0.5)
Eosinophils Relative: 3 %
HCT: 31.7 % — ABNORMAL LOW (ref 36.0–46.0)
Hemoglobin: 8.7 g/dL — ABNORMAL LOW (ref 12.0–15.0)
Immature Granulocytes: 0 %
Lymphocytes Relative: 25 %
Lymphs Abs: 1.3 10*3/uL (ref 0.7–4.0)
MCH: 19 pg — ABNORMAL LOW (ref 26.0–34.0)
MCHC: 27.4 g/dL — ABNORMAL LOW (ref 30.0–36.0)
MCV: 69.2 fL — ABNORMAL LOW (ref 80.0–100.0)
Monocytes Absolute: 0.4 10*3/uL (ref 0.1–1.0)
Monocytes Relative: 9 %
Neutro Abs: 3.2 10*3/uL (ref 1.7–7.7)
Neutrophils Relative %: 62 %
Platelets: 415 10*3/uL — ABNORMAL HIGH (ref 150–400)
RBC: 4.58 MIL/uL (ref 3.87–5.11)
RDW: 20.7 % — ABNORMAL HIGH (ref 11.5–15.5)
WBC: 5.1 10*3/uL (ref 4.0–10.5)
nRBC: 0 % (ref 0.0–0.2)

## 2022-04-19 DIAGNOSIS — R059 Cough, unspecified: Secondary | ICD-10-CM | POA: Diagnosis not present

## 2022-04-19 DIAGNOSIS — R030 Elevated blood-pressure reading, without diagnosis of hypertension: Secondary | ICD-10-CM | POA: Diagnosis not present

## 2022-04-19 DIAGNOSIS — J45902 Unspecified asthma with status asthmaticus: Secondary | ICD-10-CM | POA: Diagnosis not present

## 2022-04-20 ENCOUNTER — Encounter (HOSPITAL_COMMUNITY): Payer: Self-pay | Admitting: Anesthesiology

## 2022-04-21 ENCOUNTER — Telehealth: Payer: Self-pay | Admitting: *Deleted

## 2022-04-21 ENCOUNTER — Ambulatory Visit (HOSPITAL_COMMUNITY): Admission: RE | Admit: 2022-04-21 | Payer: Medicare Other | Source: Home / Self Care

## 2022-04-21 ENCOUNTER — Encounter (HOSPITAL_COMMUNITY): Admission: RE | Payer: Self-pay | Source: Home / Self Care

## 2022-04-21 DIAGNOSIS — R6881 Early satiety: Secondary | ICD-10-CM

## 2022-04-21 DIAGNOSIS — D509 Iron deficiency anemia, unspecified: Secondary | ICD-10-CM

## 2022-04-21 DIAGNOSIS — Z1211 Encounter for screening for malignant neoplasm of colon: Secondary | ICD-10-CM

## 2022-04-21 SURGERY — COLONOSCOPY WITH PROPOFOL
Anesthesia: Monitor Anesthesia Care

## 2022-04-21 NOTE — Telephone Encounter (Signed)
Pt is cancelling her procedure today due to being sick. Will call back to reschedule once feeling better. Endo made aware

## 2022-04-21 NOTE — Progress Notes (Signed)
No show @ 0900 for pre-op. Talked with pt. Pt stated that she was not coming for her scheduled EGD/TCS. Stated she was sick with "pneumonia". I called Dr Reola Mosher office and left message that she canceled.

## 2022-05-15 ENCOUNTER — Telehealth: Payer: Self-pay | Admitting: Internal Medicine

## 2022-05-15 NOTE — Telephone Encounter (Signed)
Called, LMOVM to call back

## 2022-05-15 NOTE — Telephone Encounter (Signed)
Pt is needing her procedures rescheduled. Please contact her daughter Juliann Pulse to arrange this. DPR on file.

## 2022-05-15 NOTE — Telephone Encounter (Signed)
Patient's granddaughter, Andee Poles, called and wanted to know if the results from her tests were back.... they haven't heard anything and were wondering.

## 2022-05-27 ENCOUNTER — Encounter: Payer: Self-pay | Admitting: *Deleted

## 2022-05-27 NOTE — Telephone Encounter (Signed)
[employer] PA APPROVED: Authorization #: [id number]  DOS:06/05/22-09/03/22

## 2022-05-27 NOTE — Telephone Encounter (Signed)
Pt has been rescheduled for 06/05/22 with Dr.carver. New instructions printed and pt's daughter to come by an pick up instructions and pre-op date.

## 2022-06-03 ENCOUNTER — Encounter (HOSPITAL_COMMUNITY)
Admission: RE | Admit: 2022-06-03 | Discharge: 2022-06-03 | Disposition: A | Discharge status: Home / Self Care | Payer: 59 | Source: Ambulatory Visit | Attending: Internal Medicine | Admitting: Internal Medicine

## 2022-06-05 ENCOUNTER — Other Ambulatory Visit: Payer: Self-pay

## 2022-06-05 ENCOUNTER — Ambulatory Visit (HOSPITAL_COMMUNITY): Payer: 59 | Admitting: Anesthesiology

## 2022-06-05 ENCOUNTER — Encounter (HOSPITAL_COMMUNITY)
Admission: RE | Disposition: A | Discharge status: Home / Self Care | Payer: Self-pay | Source: Home / Self Care | Attending: Internal Medicine

## 2022-06-05 ENCOUNTER — Ambulatory Visit (HOSPITAL_BASED_OUTPATIENT_CLINIC_OR_DEPARTMENT_OTHER): Payer: 59 | Admitting: Anesthesiology

## 2022-06-05 ENCOUNTER — Ambulatory Visit (HOSPITAL_COMMUNITY)
Admission: RE | Admit: 2022-06-05 | Discharge: 2022-06-05 | Disposition: A | Discharge status: Home / Self Care | Payer: 59 | Attending: Internal Medicine | Admitting: Internal Medicine

## 2022-06-05 ENCOUNTER — Encounter (HOSPITAL_COMMUNITY): Payer: Self-pay

## 2022-06-05 DIAGNOSIS — K648 Other hemorrhoids: Secondary | ICD-10-CM

## 2022-06-05 DIAGNOSIS — Z980 Intestinal bypass and anastomosis status: Secondary | ICD-10-CM | POA: Insufficient documentation

## 2022-06-05 DIAGNOSIS — I739 Peripheral vascular disease, unspecified: Secondary | ICD-10-CM | POA: Insufficient documentation

## 2022-06-05 DIAGNOSIS — R195 Other fecal abnormalities: Secondary | ICD-10-CM

## 2022-06-05 DIAGNOSIS — J45909 Unspecified asthma, uncomplicated: Secondary | ICD-10-CM | POA: Insufficient documentation

## 2022-06-05 DIAGNOSIS — Z79899 Other long term (current) drug therapy: Secondary | ICD-10-CM | POA: Insufficient documentation

## 2022-06-05 DIAGNOSIS — K589 Irritable bowel syndrome without diarrhea: Secondary | ICD-10-CM | POA: Insufficient documentation

## 2022-06-05 DIAGNOSIS — R6881 Early satiety: Secondary | ICD-10-CM | POA: Diagnosis not present

## 2022-06-05 DIAGNOSIS — D509 Iron deficiency anemia, unspecified: Secondary | ICD-10-CM | POA: Diagnosis not present

## 2022-06-05 DIAGNOSIS — G4730 Sleep apnea, unspecified: Secondary | ICD-10-CM | POA: Diagnosis not present

## 2022-06-05 DIAGNOSIS — Z6834 Body mass index (BMI) 34.0-34.9, adult: Secondary | ICD-10-CM | POA: Diagnosis not present

## 2022-06-05 DIAGNOSIS — I10 Essential (primary) hypertension: Secondary | ICD-10-CM | POA: Diagnosis not present

## 2022-06-05 DIAGNOSIS — Z87891 Personal history of nicotine dependence: Secondary | ICD-10-CM | POA: Insufficient documentation

## 2022-06-05 DIAGNOSIS — I2510 Atherosclerotic heart disease of native coronary artery without angina pectoris: Secondary | ICD-10-CM | POA: Diagnosis not present

## 2022-06-05 DIAGNOSIS — Z955 Presence of coronary angioplasty implant and graft: Secondary | ICD-10-CM | POA: Insufficient documentation

## 2022-06-05 DIAGNOSIS — E785 Hyperlipidemia, unspecified: Secondary | ICD-10-CM | POA: Insufficient documentation

## 2022-06-05 HISTORY — PX: ESOPHAGOGASTRODUODENOSCOPY (EGD) WITH PROPOFOL: SHX5813

## 2022-06-05 HISTORY — PX: COLONOSCOPY WITH PROPOFOL: SHX5780

## 2022-06-05 SURGERY — COLONOSCOPY WITH PROPOFOL
Anesthesia: General

## 2022-06-05 MED ORDER — PHENYLEPHRINE 80 MCG/ML (10ML) SYRINGE FOR IV PUSH (FOR BLOOD PRESSURE SUPPORT)
PREFILLED_SYRINGE | INTRAVENOUS | Status: DC | PRN
  Administered 2022-06-05: 160 ug via INTRAVENOUS
  Administered 2022-06-05 (×3): 80 ug via INTRAVENOUS

## 2022-06-05 MED ORDER — LIDOCAINE HCL (CARDIAC) PF 100 MG/5ML IV SOSY
PREFILLED_SYRINGE | INTRAVENOUS | Status: DC | PRN
  Administered 2022-06-05: 60 mg via INTRAVENOUS

## 2022-06-05 MED ORDER — PROPOFOL 10 MG/ML IV BOLUS
INTRAVENOUS | Status: DC | PRN
  Administered 2022-06-05: 50 mg via INTRAVENOUS

## 2022-06-05 MED ORDER — PROPOFOL 500 MG/50ML IV EMUL
INTRAVENOUS | Status: DC | PRN
  Administered 2022-06-05: 150 ug/kg/min via INTRAVENOUS

## 2022-06-05 MED ORDER — STERILE WATER FOR IRRIGATION IR SOLN
Status: DC | PRN
  Administered 2022-06-05: 60 mL

## 2022-06-05 MED ORDER — LIDOCAINE HCL (PF) 2 % IJ SOLN
INTRAMUSCULAR | Status: AC
  Filled 2022-06-05: qty 5

## 2022-06-05 MED ORDER — PROPOFOL 500 MG/50ML IV EMUL
INTRAVENOUS | Status: AC
  Filled 2022-06-05: qty 50

## 2022-06-05 MED ORDER — LACTATED RINGERS IV SOLN: INTRAVENOUS | Status: DC | PRN

## 2022-06-05 MED ORDER — PHENYLEPHRINE 80 MCG/ML (10ML) SYRINGE FOR IV PUSH (FOR BLOOD PRESSURE SUPPORT)
PREFILLED_SYRINGE | INTRAVENOUS | Status: AC
  Filled 2022-06-05: qty 10

## 2022-06-05 NOTE — Transfer of Care (Signed)
Immediate Anesthesia Transfer of Care Note  Patient: Tara Love  Procedure(s) Performed: COLONOSCOPY WITH PROPOFOL ESOPHAGOGASTRODUODENOSCOPY (EGD) WITH PROPOFOL  Patient Location: Short Stay  Anesthesia Type:General  Level of Consciousness: awake, alert , and oriented  Airway & Oxygen Therapy: Patient Spontanous Breathing  Post-op Assessment: Report given to RN and Post -op Vital signs reviewed and stable  Post vital signs: Reviewed and stable  Last Vitals:  Vitals Value Taken Time  BP 99/51 06/05/22 1050  Temp 36.6 C 06/05/22 1050  Pulse 76 06/05/22 1050  Resp 22 06/05/22 1050  SpO2 96 % 06/05/22 1050    Last Pain:  Vitals:   06/05/22 1050  TempSrc: Oral  PainSc: 0-No pain      Patients Stated Pain Goal: 8 (88/89/16 9450)  Complications: No notable events documented.

## 2022-06-05 NOTE — H&P (Signed)
Primary Care Physician:  Caryl Bis, MD Primary Gastroenterologist:  Dr. Abbey Chatters  Pre-Procedure History & Physical: HPI:  Tara Love is a 82 y.o. female is here for an EGD and colonoscopy to be performed for iron deficiency anemia, heme positive stool, early satiety.   Past Medical History:  Diagnosis Date   Asthma    CAD (coronary artery disease)    a. DES to diag 06/2007. b. Cath 01/2013 due to abnormal nuc at [facility]: nonobst dz, patent stent, EF 55-65%.   Complication of anesthesia    Iron deficiency anemia, unspecified    Irritable bowel syndrome    Morbid obesity ([place])    Other and unspecified hyperlipidemia    PONV (postoperative nausea and vomiting)    Sleep apnea    Unspecified asthma(493.90)    Unspecified essential hypertension     Past Surgical History:  Procedure Laterality Date   BIOPSY  10/18/2019   Procedure: BIOPSY;  Surgeon: Alphonsa Overall, MD;  Location: [facility];  Service: General;;   CARPAL TUNNEL RELEASE     Bilateral   CATARACT EXTRACTION W/PHACO Left 07/14/2016   Procedure: CATARACT EXTRACTION PHACO AND INTRAOCULAR LENS PLACEMENT ([place]);  Surgeon: Tonny Branch, MD;  Location: [facility];  Service: Ophthalmology;  Laterality: Left;  CDE: 7.04   CATARACT EXTRACTION W/PHACO Right 08/11/2016   Procedure: CATARACT EXTRACTION PHACO AND INTRAOCULAR LENS PLACEMENT (IOC);  Surgeon: Tonny Branch, MD;  Location: [facility];  Service: Ophthalmology;  Laterality: Right;  CDE: 11.58   CHOLECYSTECTOMY OPEN  1987   DILATION AND CURETTAGE OF UTERUS     ESOPHAGOGASTRODUODENOSCOPY (EGD) WITH PROPOFOL N/A 10/18/2019   Procedure: ESOPHAGOGASTRODUODENOSCOPY (EGD) WITH PROPOFOL;  Surgeon: Alphonsa Overall, MD;  Location: [facility];  Service: General;  Laterality: N/A;   GASTRIC BYPASS SURGERY  1993   Subtotal gastrectomy   [place]  01/25/2003   with mesh   LEFT HEART CATHETERIZATION WITH CORONARY ANGIOGRAM N/A 02/15/2013   Procedure: LEFT HEART CATHETERIZATION WITH  CORONARY ANGIOGRAM;  Surgeon: Peter M Martinique, MD;  Location: [facility];  Service: Cardiovascular;  Laterality: N/A;   TONSILLECTOMY     as a child    Prior to Admission medications   Medication Sig Start Date End Date Taking? Authorizing Provider  acetaminophen (TYLENOL) 500 MG tablet Take 1,000-1,500 mg by mouth 2 (two) times daily as needed for moderate pain or headache.    Yes [provider]  donepezil (ARICEPT) 10 MG tablet Take 10 mg by mouth at bedtime.   Yes [provider]  metoprolol succinate (TOPROL XL) 25 MG 24 hr tablet Take 1 tablet (25 mg total) by mouth daily. 07/10/21  Yes Josue Hector, MD  nitroGLYCERIN (NITROSTAT) 0.4 MG SL tablet Place 1 tablet (0.4 mg total) under the tongue every 5 (five) minutes as needed for chest pain (up to 3 doses). 02/16/13  Yes Dunn, Dayna N, PA-C  polyethylene glycol-electrolytes (NULYTELY) 420 g solution Take 4,000 mLs by mouth once. 03/31/22  Yes [provider]  ranolazine (RANEXA) 1000 MG SR tablet TAKE 1 TABLET BY MOUTH TWICE DAILY 11/27/17  Yes Herminio Commons, MD  simvastatin (ZOCOR) 40 MG tablet Take 40 mg by mouth at bedtime.   Yes [provider]  albuterol (PROVENTIL) (2.5 MG/3ML) 0.083% nebulizer solution Take 2.5 mg by nebulization every 4 (four) hours as needed for shortness of breath or wheezing. 05/15/22   [provider]  albuterol (VENTOLIN HFA) 108 (90 Base) MCG/ACT inhaler Inhale 2  puffs into the lungs every 4 (four) hours as needed for wheezing or shortness of breath. 10/09/19   [provider]  isosorbide mononitrate (IMDUR) 60 MG 24 hr tablet Take 1 tablet (60 mg total) by mouth daily. Patient not taking: Reported on 05/30/2022 03/17/18   Herminio Commons, MD    Allergies as of 05/27/2022 - Review Complete 04/16/2022  Allergen Reaction Noted   Codeine phosphate Nausea Only 03/22/2008   Morphine sulfate Nausea Only 03/22/2008    Family History  Problem  Relation Age of Onset   Hernia Mother 36   Cancer Father        Prostate & Hx of CHF, ?colon cancer too    Social History   Socioeconomic History   Marital status: Divorced    Spouse name: Not on file   Number of children: 2   Years of education: Not on file   Highest education level: Not on file  Occupational History   Occupation: RETIRED  Tobacco Use   Smoking status: Former    Packs/day: 2.00    Years: 18.00    Total pack years: 36.00    Types: Cigarettes    Start date: 05/20/1947    Quit date: 05/19/1965    Years since quitting: 57.0   Smokeless tobacco: Former    Types: Snuff, Chew    Quit date: 05/19/1946   Tobacco comments:    Started smoking at age 77-has not smoked in 27 years/dipped snuff and chewed tobacco during childhood  Vaping Use   Vaping Use: Never used  Substance and Sexual Activity   Alcohol use: No    Alcohol/week: 0.0 standard drinks of alcohol   Drug use: No   Sexual activity: Not Currently  Other Topics Concern   Not on file  Social History Narrative   Right handed   One story home   Drinks caffeine, usually diet coke or tea   Social Determinants of Health   Financial Resource Strain: Not on file  Food Insecurity: Not on file  Transportation Needs: Not on file  Physical Activity: Not on file  Stress: Not on file  Social Connections: Not on file  Intimate Partner Violence: Not on file    Review of Systems: General: Negative for fever, chills, fatigue, weakness. Eyes: Negative for vision changes.  ENT: Negative for hoarseness, difficulty swallowing , nasal congestion. CV: Negative for chest pain, angina, palpitations, dyspnea on exertion, peripheral edema.  Respiratory: Negative for dyspnea at rest, dyspnea on exertion, cough, sputum, wheezing.  GI: See history of present illness. GU:  Negative for dysuria, hematuria, urinary incontinence, urinary frequency, nocturnal urination.  MS: Negative for joint pain, low back pain.  Derm: Negative  for rash or itching.  Neuro: Negative for weakness, abnormal sensation, seizure, frequent headaches, memory loss, confusion.  Psych: Negative for anxiety, depression Endo: Negative for unusual weight change.  Heme: Negative for bruising or bleeding. Allergy: Negative for rash or hives.  Physical Exam: Vital signs in last 24 hours: Temp:  [98.2 F (36.8 C)] 98.2 F (36.8 C) (01/18 0847) Pulse Rate:  [82] 82 (01/18 0847) Resp:  [13] 13 (01/18 0847) BP: (127)/(46) 127/46 (01/18 0847) SpO2:  [98 %] 98 % (01/18 0847) Weight:  [94.3 kg] 94.3 kg (01/18 0847)   General:   Alert,  Well-developed, well-nourished, pleasant and cooperative in NAD Head:  Normocephalic and atraumatic. Eyes:  Sclera clear, no icterus.   Conjunctiva pink. Ears:  Normal auditory acuity. Nose:  No deformity, discharge,  or lesions. Msk:  Symmetrical without gross deformities. Normal posture. Extremities:  Without clubbing or edema. Neurologic:  Alert and  oriented x4;  grossly normal neurologically. Skin:  Intact without significant lesions or rashes. Psych:  Alert and cooperative. Normal mood and affect.   Impression/Plan: Tara Love is here for an EGD and colonoscopy to be performed for iron deficiency anemia, heme positive stool, early satiety.   Risks, benefits, limitations, imponderables and alternatives regarding EGD have been reviewed with the patient. Questions have been answered. All parties agreeable.

## 2022-06-05 NOTE — Op Note (Signed)
[facility] Patient Name: Tara Love Procedure Date: 06/05/2022 10:14 AM MRN: [id number] Date of Birth: Oct 01, 1940 Attending MD: Elon Alas. Edgar Frisk, [id number] CSN: [id number] Age: 82 Admit Type: Outpatient Procedure:                Colonoscopy Indications:              Heme positive stool, Iron deficiency anemia Providers:                Elon Alas. Abbey Chatters, DO, Gwynneth Albright RN, RN,                            Illene Labrador Referring MD:              Medicines:                See the Anesthesia note for documentation of the                            administered medications Complications:            No immediate complications. Estimated Blood Loss:     Estimated blood loss: none. Procedure:                Pre-Anesthesia Assessment:                           - The anesthesia plan was to use monitored                            anesthesia care (MAC).                           After obtaining informed consent, the colonoscope                            was passed under direct vision. Throughout the                            procedure, the patient's blood pressure, pulse, and                            oxygen saturations were monitored continuously. The                            [id number]-HQ190L ([id number]) scope was introduced through                            the anus and advanced to the the cecum, identified                            by appendiceal orifice and ileocecal valve. The                            colonoscopy was performed without difficulty. The                            patient tolerated the procedure  well. The quality                            of the bowel preparation was evaluated using the                            BBPS [facility] Bowel Preparation Scale) with scores                            of: Right Colon = 3, Transverse Colon = 3 and Left                            Colon = 3 (entire mucosa seen well with no residual                             staining, small fragments of stool or opaque                            liquid). The total BBPS score equals 9. Scope In: 10:32:59 AM Scope Out: 10:45:54 AM Scope Withdrawal Time: 0 hours 10 minutes 24 seconds  Total Procedure Duration: 0 hours 12 minutes 55 seconds  Findings:      The perianal and digital rectal examinations were normal.      Non-bleeding internal hemorrhoids were found during endoscopy.      The entire examined colon appeared normal. Impression:               - Non-bleeding internal hemorrhoids.                           - The entire examined colon is normal.                           - No specimens collected. Moderate Sedation:      Per Anesthesia Care Recommendation:           - Patient has a contact number available for                            emergencies. The signs and symptoms of potential                            delayed complications were discussed with the                            patient. Return to normal activities tomorrow.                            Written discharge instructions were provided to the                            patient.                           - Resume previous diet.                           -  Continue present medications.                           - No repeat colonoscopy due to age.                           - Return to GI clinic in 3 months. Consider capsule                            endoscopy for comepleteness if Hgb <10 Procedure Code(s):        --- Professional ---                           [phone], Colonoscopy, flexible; diagnostic, including                            collection of specimen(s) by brushing or washing,                            when performed (separate procedure) Diagnosis Code(s):        --- Professional ---                           K64.8, Other hemorrhoids                           R19.5, Other fecal abnormalities                           D50.9, Iron deficiency anemia, unspecified CPT copyright 2022  American Medical Association. All rights reserved. The codes documented in this report are preliminary and upon coder review may  be revised to meet current compliance requirements. Elon Alas. Abbey Chatters, DO [place] Abbey Chatters, DO 06/05/2022 10:48:50 AM This report has been signed electronically. Number of Addenda: 0

## 2022-06-05 NOTE — Anesthesia Preprocedure Evaluation (Addendum)
Anesthesia Evaluation  Patient identified by MRN, date of birth, ID band Patient awake    Reviewed: Allergy & Precautions, NPO status , Patient's Chart, lab work & pertinent test results  History of Anesthesia Complications (+) history of anesthetic complications  Airway Mallampati: I  TM Distance: >3 FB     Dental  (+) Edentulous Upper, Partial Lower   Pulmonary asthma , sleep apnea and Continuous Positive Airway Pressure Ventilation , former smoker   breath sounds clear to auscultation       Cardiovascular hypertension, Pt. on medications and Pt. on home beta blockers + CAD (non-obst), + Cardiac Stents and + Peripheral Vascular Disease  Normal cardiovascular exam+ Valvular Problems/Murmurs (very mild AS, gradient 13, AVA 1.93cm2) AS  Rhythm:Regular Rate:Normal     Neuro/Psych  negative psych ROS   GI/Hepatic negative GI ROS, Neg liver ROS,,,  Endo/Other    Morbid obesity  Renal/GU negative Renal ROS  negative genitourinary   Musculoskeletal   Abdominal  (+) + obese  Peds  Hematology   Anesthesia Other Findings   Reproductive/Obstetrics                             Anesthesia Physical Anesthesia Plan  ASA: 3  Anesthesia Plan: General   Post-op Pain Management: Minimal or no pain anticipated   Induction: Intravenous  PONV Risk Score and Plan: 1 and Propofol infusion and TIVA  Airway Management Planned: Nasal Cannula and Natural Airway  Additional Equipment:   Intra-op Plan:   Post-operative Plan:   Informed Consent: I have reviewed the patients History and Physical, chart, labs and discussed the procedure including the risks, benefits and alternatives for the proposed anesthesia with the patient or authorized representative who has indicated his/her understanding and acceptance.       Plan Discussed with: CRNA  Anesthesia Plan Comments:         Anesthesia Quick  Evaluation

## 2022-06-05 NOTE — Anesthesia Postprocedure Evaluation (Signed)
Anesthesia Post Note  Patient: Tara Love  Procedure(s) Performed: COLONOSCOPY WITH PROPOFOL ESOPHAGOGASTRODUODENOSCOPY (EGD) WITH PROPOFOL  Patient location during evaluation: Endoscopy Anesthesia Type: General Level of consciousness: awake and alert Pain management: pain level controlled Vital Signs Assessment: post-procedure vital signs reviewed and stable Respiratory status: spontaneous breathing, nonlabored ventilation, respiratory function stable and patient connected to nasal cannula oxygen Cardiovascular status: blood pressure returned to baseline and stable Postop Assessment: no apparent nausea or vomiting Anesthetic complications: no   There were no known notable events for this encounter.   Last Vitals:  Vitals:   06/05/22 1050 06/05/22 1056  BP: (!) 99/51 116/67  Pulse: 76   Resp: (!) 22   Temp: 36.6 C   SpO2: 96%     Last Pain:  Vitals:   06/05/22 1050  TempSrc: Oral  PainSc: 0-No pain                 Trixie Rude

## 2022-06-05 NOTE — Op Note (Signed)
[facility] Patient Name: Tara Love Procedure Date: 06/05/2022 10:13 AM MRN: [id number] Date of Birth: 12/31/1940 Attending MD: Elon Alas. Edgar Frisk, [id number] CSN: [id number] Age: 82 Admit Type: Outpatient Procedure:                Upper GI endoscopy Indications:              Iron deficiency anemia, Heme positive stool, Early                            satiety Providers:                Elon Alas. Abbey Chatters, DO, Gwynneth Albright RN, RN,                            Illene Labrador Referring MD:              Medicines:                See the Anesthesia note for documentation of the                            administered medications Complications:            No immediate complications. Estimated Blood Loss:     Estimated blood loss: none. Procedure:                Pre-Anesthesia Assessment:                           - The anesthesia plan was to use monitored                            anesthesia care (MAC).                           After obtaining informed consent, the endoscope was                            passed under direct vision. Throughout the                            procedure, the patient's blood pressure, pulse, and                            oxygen saturations were monitored continuously. The                            [id number]-H190 ([id number]) scope was introduced through the                            mouth, and advanced to the efferent jejunal loop.                            The upper GI endoscopy was accomplished without                            difficulty. The patient tolerated the procedure  well. Scope In: 10:25:14 AM Scope Out: 10:27:25 AM Total Procedure Duration: 0 hours 2 minutes 11 seconds  Findings:      The examined esophagus was normal.      Evidence of a Roux-en-Y gastrojejunostomy was found. The gastrojejunal       anastomosis was characterized by healthy appearing mucosa. This was       traversed. The jejunojejunal  anastomosis was characterized by healthy       appearing mucosa. The duodenum-to-jejunum limb was not examined as it       could not be reached.      The examined jejunum was normal. Impression:               - Normal esophagus.                           - Roux-en-Y gastrojejunostomy with gastrojejunal                            anastomosis characterized by healthy appearing                            mucosa.                           - Normal examined jejunum.                           - No specimens collected. Moderate Sedation:      Per Anesthesia Care Recommendation:           - Patient has a contact number available for                            emergencies. The signs and symptoms of potential                            delayed complications were discussed with the                            patient. Return to normal activities tomorrow.                            Written discharge instructions were provided to the                            patient.                           - Resume previous diet.                           - Continue present medications.                           - Proceed with colonoscopy Procedure Code(s):        --- Professional ---                           [phone], Esophagogastroduodenoscopy,  flexible,                            transoral; diagnostic, including collection of                            specimen(s) by brushing or washing, when performed                            (separate procedure) Diagnosis Code(s):        --- Professional ---                           Z98.0, Intestinal bypass and anastomosis status                           D50.9, Iron deficiency anemia, unspecified                           R19.5, Other fecal abnormalities                           R68.81, Early satiety CPT copyright 2022 American Medical Association. All rights reserved. The codes documented in this report are preliminary and upon coder review may  be revised to meet  current compliance requirements. Elon Alas. Abbey Chatters, DO [place] Abbey Chatters, DO 06/05/2022 10:29:47 AM This report has been signed electronically. Number of Addenda: 0

## 2022-06-05 NOTE — Discharge Instructions (Signed)
EGD Discharge instructions Please read the instructions outlined below and refer to this sheet in the next few weeks. These discharge instructions provide you with general information on caring for yourself after you leave the hospital. Your doctor may also give you specific instructions. While your treatment has been planned according to the most current medical practices available, unavoidable complications occasionally occur. If you have any problems or questions after discharge, please call your doctor. ACTIVITY You may resume your regular activity but move at a slower pace for the next 24 hours.  Take frequent rest periods for the next 24 hours.  Walking will help expel (get rid of) the air and reduce the bloated feeling in your abdomen.  No driving for 24 hours (because of the anesthesia (medicine) used during the test).  You may shower.  Do not sign any important legal documents or operate any machinery for 24 hours (because of the anesthesia used during the test).  NUTRITION Drink plenty of fluids.  You may resume your normal diet.  Begin with a light meal and progress to your normal diet.  Avoid alcoholic beverages for 24 hours or as instructed by your caregiver.  MEDICATIONS You may resume your normal medications unless your caregiver tells you otherwise.  WHAT YOU CAN EXPECT TODAY You may experience abdominal discomfort such as a feeling of fullness or "gas" pains.  FOLLOW-UP Your doctor will discuss the results of your test with you.  SEEK IMMEDIATE MEDICAL ATTENTION IF ANY OF THE FOLLOWING OCCUR: Excessive nausea (feeling sick to your stomach) and/or vomiting.  Severe abdominal pain and distention (swelling).  Trouble swallowing.  Temperature over 101 F (37.8 C).  Rectal bleeding or vomiting of blood.    Colonoscopy Discharge Instructions  Read the instructions outlined below and refer to this sheet in the next few weeks. These discharge instructions provide you with  general information on caring for yourself after you leave the hospital. Your doctor may also give you specific instructions. While your treatment has been planned according to the most current medical practices available, unavoidable complications occasionally occur.   ACTIVITY You may resume your regular activity, but move at a slower pace for the next 24 hours.  Take frequent rest periods for the next 24 hours.  Walking will help get rid of the air and reduce the bloated feeling in your belly (abdomen).  No driving for 24 hours (because of the medicine (anesthesia) used during the test).   Do not sign any important legal documents or operate any machinery for 24 hours (because of the anesthesia used during the test).  NUTRITION Drink plenty of fluids.  You may resume your normal diet as instructed by your doctor.  Begin with a light meal and progress to your normal diet. Heavy or fried foods are harder to digest and may make you feel sick to your stomach (nauseated).  Avoid alcoholic beverages for 24 hours or as instructed.  MEDICATIONS You may resume your normal medications unless your doctor tells you otherwise.  WHAT YOU CAN EXPECT TODAY Some feelings of bloating in the abdomen.  Passage of more gas than usual.  Spotting of blood in your stool or on the toilet paper.  IF YOU HAD POLYPS REMOVED DURING THE COLONOSCOPY: No aspirin products for 7 days or as instructed.  No alcohol for 7 days or as instructed.  Eat a soft diet for the next 24 hours.  FINDING OUT THE RESULTS OF YOUR TEST Not all test results are available  during your visit. If your test results are not back during the visit, make an appointment with your caregiver to find out the results. Do not assume everything is normal if you have not heard from your caregiver or the medical facility. It is important for you to follow up on all of your test results.  SEEK IMMEDIATE MEDICAL ATTENTION IF: You have more than a spotting of  blood in your stool.  Your belly is swollen (abdominal distention).  You are nauseated or vomiting.  You have a temperature over 101.  You have abdominal pain or discomfort that is severe or gets worse throughout the day.   Overall everything looked very healthy.  Your bypass looked good.  Your colon looked very healthy.  No active inflammation throughout your entire colon.  No evidence of bleeding.  No polyps or evidence of colon cancer.  Given your age, I do not think you need repeat colonoscopy in the future.  We may consider capsule endoscopy to evaluate your small bowel.  We can discuss further on follow-up visit in GI clinic in 3 months.  I hope you have a great rest of your week!  Elon Alas. Abbey Chatters, D.O. Gastroenterology and Hepatology [facility]

## 2022-06-10 ENCOUNTER — Encounter (HOSPITAL_COMMUNITY): Payer: Self-pay | Admitting: Internal Medicine

## 2022-06-18 DIAGNOSIS — E782 Mixed hyperlipidemia: Secondary | ICD-10-CM | POA: Diagnosis not present

## 2022-06-18 DIAGNOSIS — I10 Essential (primary) hypertension: Secondary | ICD-10-CM | POA: Diagnosis not present

## 2022-06-24 DIAGNOSIS — R634 Abnormal weight loss: Secondary | ICD-10-CM | POA: Diagnosis not present

## 2022-06-24 DIAGNOSIS — R109 Unspecified abdominal pain: Secondary | ICD-10-CM | POA: Diagnosis not present

## 2022-06-30 DIAGNOSIS — R109 Unspecified abdominal pain: Secondary | ICD-10-CM | POA: Diagnosis not present

## 2022-06-30 DIAGNOSIS — R634 Abnormal weight loss: Secondary | ICD-10-CM | POA: Diagnosis not present

## 2022-07-01 ENCOUNTER — Other Ambulatory Visit: Payer: Self-pay | Admitting: Cardiovascular Disease

## 2022-07-17 DIAGNOSIS — E782 Mixed hyperlipidemia: Secondary | ICD-10-CM | POA: Diagnosis not present

## 2022-07-17 DIAGNOSIS — I10 Essential (primary) hypertension: Secondary | ICD-10-CM | POA: Diagnosis not present

## 2022-08-04 DIAGNOSIS — E162 Hypoglycemia, unspecified: Secondary | ICD-10-CM | POA: Diagnosis not present

## 2022-08-04 DIAGNOSIS — R5383 Other fatigue: Secondary | ICD-10-CM | POA: Diagnosis not present

## 2022-08-04 DIAGNOSIS — D511 Vitamin B12 deficiency anemia due to selective vitamin B12 malabsorption with proteinuria: Secondary | ICD-10-CM | POA: Diagnosis not present

## 2022-08-04 DIAGNOSIS — I10 Essential (primary) hypertension: Secondary | ICD-10-CM | POA: Diagnosis not present

## 2022-08-04 DIAGNOSIS — E782 Mixed hyperlipidemia: Secondary | ICD-10-CM | POA: Diagnosis not present

## 2022-08-04 DIAGNOSIS — N1831 Chronic kidney disease, stage 3a: Secondary | ICD-10-CM | POA: Diagnosis not present

## 2022-08-04 DIAGNOSIS — E7849 Other hyperlipidemia: Secondary | ICD-10-CM | POA: Diagnosis not present

## 2022-08-04 DIAGNOSIS — D649 Anemia, unspecified: Secondary | ICD-10-CM | POA: Diagnosis not present

## 2022-08-04 DIAGNOSIS — E559 Vitamin D deficiency, unspecified: Secondary | ICD-10-CM | POA: Diagnosis not present

## 2022-08-04 DIAGNOSIS — D529 Folate deficiency anemia, unspecified: Secondary | ICD-10-CM | POA: Diagnosis not present

## 2022-08-11 DIAGNOSIS — D511 Vitamin B12 deficiency anemia due to selective vitamin B12 malabsorption with proteinuria: Secondary | ICD-10-CM | POA: Diagnosis not present

## 2022-08-11 DIAGNOSIS — M19041 Primary osteoarthritis, right hand: Secondary | ICD-10-CM | POA: Diagnosis not present

## 2022-08-11 DIAGNOSIS — M545 Low back pain, unspecified: Secondary | ICD-10-CM | POA: Diagnosis not present

## 2022-08-11 DIAGNOSIS — D649 Anemia, unspecified: Secondary | ICD-10-CM | POA: Diagnosis not present

## 2022-08-11 DIAGNOSIS — I10 Essential (primary) hypertension: Secondary | ICD-10-CM | POA: Diagnosis not present

## 2022-08-11 DIAGNOSIS — E7849 Other hyperlipidemia: Secondary | ICD-10-CM | POA: Diagnosis not present

## 2022-08-11 DIAGNOSIS — R5383 Other fatigue: Secondary | ICD-10-CM | POA: Diagnosis not present

## 2022-08-11 DIAGNOSIS — G309 Alzheimer's disease, unspecified: Secondary | ICD-10-CM | POA: Diagnosis not present

## 2022-08-11 DIAGNOSIS — G4733 Obstructive sleep apnea (adult) (pediatric): Secondary | ICD-10-CM | POA: Diagnosis not present

## 2022-08-11 DIAGNOSIS — E559 Vitamin D deficiency, unspecified: Secondary | ICD-10-CM | POA: Diagnosis not present

## 2022-08-11 DIAGNOSIS — J4520 Mild intermittent asthma, uncomplicated: Secondary | ICD-10-CM | POA: Diagnosis not present

## 2022-09-02 NOTE — Progress Notes (Signed)
Referring Provider: Richardean Chimera, MD Primary Care Physician:  Richardean Chimera, MD Primary GI Physician: Dr. Marletta Lor  Chief Complaint  Patient presents with   Follow-up    Follow up. No problems     HPI:   Tara Love is a 82 y.o. female with history of CAD s/p stent in 2009 on Plavix, OSA not on CPAP, TIA, hematemesis in June 2023 with CT imaging noting large hematoma (18.9 x 7.5 cm) in the excluded segment of the post gastric bypass stomach in 10/2021. EGD 10/2021 while inpatient at [facility] revealed normal post-RNY bypass anatomy, both afferent/efferent small bowel limbs were intubated and inspected.  Small dimples noted in the gastric body, but no obvious ulcer or fistulous tracts present.  She required one unit of prbcs due to notable drop in Hgb from 12 to 7. Her Plavix was stopped.  She is presenting today for follow-up of IDA.   Last seen in our office at the time of initial consult 02/24/2022.  She reported issues with her stomach her entire life.  Gastric bypass over 30 years ago.  Nausea at times, does not eat well.  Eating cookies or sandwiches as it was easier than cooking for 1.  Described early satiety, but had been maintaining her weight.  No overt GI bleeding, abdominal pain, or any other significant GI symptoms.  Recommended repeat CT to see if prior hematoma had resolved.  If negative, consider EGD and colonoscopy to evaluate IDA/heme positive stool.  CT A/P with contrast 03/18/2022 with resolution of hematoma.   Colonoscopy 06/05/2022: Nonbleeding internal hemorrhoids, otherwise normal exam.  No recommendations to repeat colonoscopy due to age.  Recommended considering capsule endoscopy for completeness if hemoglobin drops below 10.  EGD 06/05/2022: Normal esophagus, Roux-en-Y gastrojejunostomy with gastrojejunal anastomosis characterized by healthy-appearing mucosa, normal examined jejunum.    Today: Last labs on file 04/16/2022 with hemoglobin 8.7. Reports  she had blood work with Dr. Garner Nash not too long ago.  Was told that her hemoglobin had improved, but was still low. She is taking iron daily.  She is never had IV iron. Denies BRBPR or melena. Bowels are moving well.  She continues to struggle with nausea which occurs most every day as well as early satiety.  She reports the symptoms are chronic with nausea dating back to her teen years.  No vomiting.  She is currently taking Zofran as needed.  Reports that nausea does not seem to be affected by meals.  She also has daily headaches, but is not sure if her nausea occurs at the same time as her headaches.  No heartburn or dysphagia.  She eats about once a day and may have a snack.  She has a lot of memory trouble.  Reports she fell and hit her head in the bathtub, but unable to tell me exactly when this happened.  Has not discussed with Dr. Garner Nash.  She has documented 23 pound weight loss in the last 5 months.   Also with several year history of constant mild discomfort in the upper abdomen.  Not affected by meals or bowel habits.  She has had hernia surgery, gastric bypass, and cholecystectomy.  Denies NSAIDs.   Past Medical History:  Diagnosis Date   Asthma    CAD (coronary artery disease)    a. DES to diag 06/2007. b. Cath 01/2013 due to abnormal nuc at [facility]: nonobst dz, patent stent, EF 55-65%.   Complication of anesthesia  Iron deficiency anemia, unspecified    Irritable bowel syndrome    Morbid obesity    Other and unspecified hyperlipidemia    PONV (postoperative nausea and vomiting)    Sleep apnea    Unspecified asthma(493.90)    Unspecified essential hypertension     Past Surgical History:  Procedure Laterality Date   BIOPSY  10/18/2019   Procedure: BIOPSY;  Surgeon: Ovidio Kin, MD;  Location: [facility];  Service: General;;   CARPAL TUNNEL RELEASE     Bilateral   CATARACT EXTRACTION W/PHACO Left 07/14/2016   Procedure: CATARACT EXTRACTION PHACO AND INTRAOCULAR  LENS PLACEMENT (IOC);  Surgeon: Gemma Payor, MD;  Location: [facility];  Service: Ophthalmology;  Laterality: Left;  CDE: 7.04   CATARACT EXTRACTION W/PHACO Right 08/11/2016   Procedure: CATARACT EXTRACTION PHACO AND INTRAOCULAR LENS PLACEMENT (IOC);  Surgeon: Gemma Payor, MD;  Location: [facility];  Service: Ophthalmology;  Laterality: Right;  CDE: 11.58   CHOLECYSTECTOMY OPEN  1987   COLONOSCOPY WITH PROPOFOL N/A 06/05/2022   Procedure: COLONOSCOPY WITH PROPOFOL;  Surgeon: Lanelle Bal, DO;  Location: [facility];  Service: Endoscopy;  Laterality: N/A;  9:00 am, pt knows to arrive at 8:45   DILATION AND CURETTAGE OF UTERUS     ESOPHAGOGASTRODUODENOSCOPY (EGD) WITH PROPOFOL N/A 10/18/2019   Procedure: ESOPHAGOGASTRODUODENOSCOPY (EGD) WITH PROPOFOL;  Surgeon: Ovidio Kin, MD;  Location: [facility];  Service: General;  Laterality: N/A;   ESOPHAGOGASTRODUODENOSCOPY (EGD) WITH PROPOFOL N/A 06/05/2022   Procedure: ESOPHAGOGASTRODUODENOSCOPY (EGD) WITH PROPOFOL;  Surgeon: Lanelle Bal, DO;  [facility]: [facility];  Service: Endoscopy;  Laterality: N/A;   GASTRIC BYPASS SURGERY  1993   Subtotal gastrectomy   INCISIONAL HERNIA REPAIR  01/25/2003   with mesh   LEFT HEART CATHETERIZATION WITH CORONARY ANGIOGRAM N/A 02/15/2013   Procedure: LEFT HEART CATHETERIZATION WITH CORONARY ANGIOGRAM;  Surgeon: Peter M Swaziland, MD;  Location: [facility] CATH LAB;  Service: Cardiovascular;  Laterality: N/A;   TONSILLECTOMY     as a child    Current Outpatient Medications  Medication Sig Dispense Refill   acetaminophen (TYLENOL) 500 MG tablet Take 1,000-1,500 mg by mouth 2 (two) times daily as needed for moderate pain or headache.      albuterol (PROVENTIL) (2.5 MG/3ML) 0.083% nebulizer solution Take 2.5 mg by nebulization every 4 (four) hours as needed for shortness of breath or wheezing.     albuterol (VENTOLIN HFA) 108 (90 Base) MCG/ACT inhaler Inhale 2 puffs into the lungs every 4 (four) hours as needed for wheezing or  shortness of breath.     donepezil (ARICEPT) 10 MG tablet Take 10 mg by mouth at bedtime.     ferrous sulfate 325 (65 FE) MG tablet Take 325 mg by mouth daily with breakfast.     metoprolol succinate (TOPROL-XL) 25 MG 24 hr tablet Take 1 tablet by mouth once daily 30 tablet 0   nitroGLYCERIN (NITROSTAT) 0.4 MG SL tablet Place 1 tablet (0.4 mg total) under the tongue every 5 (five) minutes as needed for chest pain (up to 3 doses).     ondansetron (ZOFRAN) 4 MG tablet Take 4 mg by mouth every 8 (eight) hours as needed for nausea or vomiting.     pantoprazole (PROTONIX) 40 MG tablet Take 1 tablet (40 mg total) by mouth daily before breakfast. 30 tablet 3   ranolazine (RANEXA) 1000 MG SR tablet TAKE 1 TABLET BY MOUTH TWICE DAILY 60 tablet 6   simvastatin (ZOCOR) 40 MG tablet Take 40  mg by mouth at bedtime.     isosorbide mononitrate (IMDUR) 60 MG 24 hr tablet Take 1 tablet (60 mg total) by mouth daily. (Patient not taking: Reported on 09/04/2022) 30 tablet 6   No current facility-administered medications for this visit.    Allergies as of 09/04/2022 - Review Complete 09/04/2022  Allergen Reaction Noted   Codeine phosphate Nausea Only 03/22/2008   Morphine sulfate Nausea Only 03/22/2008    Family History  Problem Relation Age of Onset   Hernia Mother 27   Cancer Father        Prostate & Hx of CHF, ?colon cancer too    Social History   Socioeconomic History   Marital status: Divorced    Spouse name: Not on file   Number of children: 2   Years of education: Not on file   Highest education level: Not on file  Occupational History   Occupation: RETIRED  Tobacco Use   Smoking status: Former    Packs/day: 2.00    Years: 18.00    Additional pack years: 0.00    Total pack years: 36.00    Types: Cigarettes    Start date: 05/20/1947    Quit date: 05/19/1965    Years since quitting: 57.3   Smokeless tobacco: Former    Types: Snuff, Chew    Quit date: 05/19/1946   Tobacco comments:     Started smoking at age 36-has not smoked in 63 years/dipped snuff and chewed tobacco during childhood  Vaping Use   Vaping Use: Never used  Substance and Sexual Activity   Alcohol use: No    Alcohol/week: 0.0 standard drinks of alcohol   Drug use: No   Sexual activity: Not Currently  Other Topics Concern   Not on file  Social History Narrative   Right handed   One story home   Drinks caffeine, usually diet coke or tea   Social Determinants of Health   Financial Resource Strain: Not on file  Food Insecurity: Not on file  Transportation Needs: Not on file  Physical Activity: Not on file  Stress: Not on file  Social Connections: Not on file    Review of Systems: Gen: Denies fever, chills, cold or flulike symptoms, presyncope, syncope. CV: Denies chest pain, palpitations. Resp: Denies dyspnea, cough. GI: See HPI Heme: See HPI  Physical Exam: BP 130/80 (BP Location: Right Arm, Patient Position: Sitting, Cuff Size: Normal)   Pulse 75   Temp 97.6 F (36.4 C) (Temporal)   Ht  (1.651 m)   Wt 199 lb 12.8 oz (90.6 kg)   SpO2 97%   BMI 33.25 kg/m  General:   Alert and oriented. No distress noted. Pleasant and cooperative.  Head:  Normocephalic and atraumatic. Eyes:  Conjuctiva clear without scleral icterus. Heart:  S1, S2 present without murmurs appreciated. Lungs:  Clear to auscultation bilaterally. No wheezes, rales, or rhonchi. No distress.  Abdomen:  +BS, soft, and non-distended.  Mild TTP across the upper abdomen, primarily epigastric and LUQ region.  No rebound or guarding. No HSM or masses noted. Msk:  Symmetrical without gross deformities. Normal posture. Extremities:  Without edema. Neurologic:  Alert and  oriented x4 Psych:  Normal mood and affect.    Assessment:  82 y.o. female with history of CAD s/p stent in 2009 on Plavix, OSA not on CPAP, TIA, hematemesis in June 2023 with CT imaging noting large hematoma (18.9 x 7.5 cm) in the excluded segment of the  post gastric bypass  stomach in 10/2021. EGD 10/2021 while inpatient at [facility] revealed normal post-RNY bypass anatomy, both afferent/efferent small bowel limbs were intubated and inspected.  Small dimples noted in the gastric body, but no obvious ulcer or fistulous tracts present.  She required one unit of prbcs due to notable drop in Hgb from 12 to 7. Her Plavix was stopped.  She is presenting today for follow-up of IDA, also reporting ongoing nausea and early satiety.   IDA: Prior acute decline in hemoglobin from 12-7 in June last year in the setting of hematemesis and apparent hematoma in the excluded segment of the post gastric bypass stomach.  Repeat CT A/P with contrast 03/18/2022 with resolution of prior hematoma.  She denies overt GI bleeding though she was previously found to have heme positive stools.  Recently underwent EGD and colonoscopy 06/05/2022 with nonbleeding internal hemorrhoids, normal-appearing colon. Roux-en-Y gastrojejunostomy with gastrojejunal anastomosis with healthy-appearing mucosa, normal examined jejunum.  Dr. Marletta Lor recommended considering capsule if hemoglobin dropped below 10; however, it doesn't appear that hemoglobin has improved since acute decline last year. Last hemoglobin on file 04/16/2022 with hemoglobin improved to 8.7.  Patient reports recent blood work with Dr. Garner Nash who told her hemoglobin had improved, but was not quite back to normal.  She is taking oral iron daily.  Suspect her acute decline was secondary to hematemesis/hematoma.  Heme positive stools may have been related to the same.  Unable to rule out small bowel etiology contributing, but I suspect her persistent anemia, slow to improve hemoglobin is secondary to malabsorption in the setting of Roux-en-Y gastric bypass.  We discussed the need for IV iron as she is not able to absorb oral iron appropriately; however, patient and her daughter requested to discuss with Dr. Garner Nash before considering  this.  We discussed the possibility of proceeding with capsule endoscopy, but it was ultimately decided for me to follow-up on recent blood work with Dr. Garner Nash to see where patient's hemoglobin is currently.  Nausea/early satiety:  Patient reports chronic nausea most days of the week dating back to her teen years.  She is also developed early satiety in the last several months to a year.  She has trouble giving me any detailed history on her symptoms, but denies nausea being affected by meals or bowel movements. I do note that she has lost 23 pounds in the last 5 months which she reports has been unintentional.  Reports eating about 1 meal a day and occasionally a snack as she is not hungry which likely accounts for her weight loss. Etiology of nausea/early satiety is unclear.  Recent EGD with evidence of Roux-en-Y gastric bypass with healthy-appearing mucosa.  Recent CT A/P with contrast with nothing to explain nausea or weight loss.  Recent colonoscopy also normal. Will arrange for gastric emptying study to evaluate gastroparesis.  I will also start her on daily pantoprazole for possible atypical reflux.  She also reported chronic, frequent headaches which could also be contributing to her chronic nausea.    Upper abdominal pain:  Chronic, constant discomfort in the upper abdomen, not affected by meals or bowel movements.  Recent CT A/P with contrast with no significant abnormalities.  Recent colonoscopy and EGD as per above without significant abnormality.  She has history of Roux-en-Y gastric bypass, ventral hernia repair, and cholecystectomy.  Chronic pain may be related to adhesions.    Plan:  Request recent labs from Dr. Garner Nash. Recommend referral to hematology for IV iron, but patient/daughter requested to  discuss with Dr. Garner Nash. Gastric emptying study. Start pantoprazole 40 mg daily 30 minutes before breakfast. Continue Zofran every 8 hours as needed. 4-6 small meals daily. 1 protein  shake daily. Further recommendations to follow gastric emptying study and review of recent labs.   Ermalinda Memos, PA-C [facility] 09/04/2022

## 2022-09-04 ENCOUNTER — Ambulatory Visit (INDEPENDENT_AMBULATORY_CARE_PROVIDER_SITE_OTHER): Payer: 59 | Admitting: Gastroenterology

## 2022-09-04 ENCOUNTER — Encounter: Payer: Self-pay | Admitting: Gastroenterology

## 2022-09-04 VITALS — BP 130/80 | HR 75 | Temp 97.6°F | Ht 65.0 in | Wt 199.8 lb

## 2022-09-04 DIAGNOSIS — R1010 Upper abdominal pain, unspecified: Secondary | ICD-10-CM | POA: Insufficient documentation

## 2022-09-04 DIAGNOSIS — R110 Nausea: Secondary | ICD-10-CM | POA: Diagnosis not present

## 2022-09-04 DIAGNOSIS — D509 Iron deficiency anemia, unspecified: Secondary | ICD-10-CM | POA: Diagnosis not present

## 2022-09-04 DIAGNOSIS — R6881 Early satiety: Secondary | ICD-10-CM | POA: Diagnosis not present

## 2022-09-04 DIAGNOSIS — R634 Abnormal weight loss: Secondary | ICD-10-CM | POA: Diagnosis not present

## 2022-09-04 MED ORDER — PANTOPRAZOLE SODIUM 40 MG PO TBEC: 40.0000 mg | DELAYED_RELEASE_TABLET | Freq: Every day | ORAL | 3 refills | Status: DC

## 2022-09-04 NOTE — Patient Instructions (Addendum)
We will arrange for you to have a gastric emptying study at [facility].   Start pantoprazole 40 mg daily 30 minutes before breakfast.   Continue using Zofran as needed for nausea.   Try to eat 4-6 small meals daily.  Drink 1 protein shake daily.  Few good options are Ensure, boost, Premier protein, fair life protein shakes.  I am requesting your recent blood work from Dr. Garner Nash for review.  We will call you with further recommendations.  As we discussed, due to your history of Roux-en-Y gastric bypass, you will not absorb iron as you should in you should see hematology for IV iron.  As requested, you can discuss this with Dr. Garner Nash.  I would be happy to place a referral for you.  We will call you with your imaging study results and further recommendations thereafter.  Ermalinda Memos, PA-C [facility]

## 2022-09-05 ENCOUNTER — Telehealth: Payer: Self-pay | Admitting: *Deleted

## 2022-09-05 ENCOUNTER — Ambulatory Visit: Payer: 59 | Admitting: Gastroenterology

## 2022-09-05 NOTE — Telephone Encounter (Signed)
Per [facility] "Notification/Prior Authorization not required for this service."   GES 4/25, arrival 9:45am, npo midnight, no stomach meds after midnight including zofran  Called pt and made aware of appt details. Also called Melba and she is aware of appt details also.

## 2022-09-11 ENCOUNTER — Encounter (HOSPITAL_COMMUNITY)
Admission: RE | Admit: 2022-09-11 | Discharge: 2022-09-11 | Disposition: A | Discharge status: Home / Self Care | Payer: 59 | Source: Ambulatory Visit | Attending: Gastroenterology | Admitting: Gastroenterology

## 2022-09-11 DIAGNOSIS — R6881 Early satiety: Secondary | ICD-10-CM | POA: Insufficient documentation

## 2022-09-11 DIAGNOSIS — R110 Nausea: Secondary | ICD-10-CM | POA: Insufficient documentation

## 2022-09-11 DIAGNOSIS — R634 Abnormal weight loss: Secondary | ICD-10-CM | POA: Diagnosis not present

## 2022-09-11 MED ORDER — TECHNETIUM TC 99M SULFUR COLLOID
1.9000 | Freq: Once | INTRAVENOUS | Status: AC | PRN
  Administered 2022-09-11: 1.9 via ORAL

## 2022-09-14 ENCOUNTER — Telehealth: Payer: Self-pay | Admitting: Gastroenterology

## 2022-09-14 NOTE — Telephone Encounter (Signed)
Received labs from PCP dated 08/04/2022.  Hemoglobin improved, but remains low at 9.7.  She remains iron deficient with ferritin 5, iron saturation 4%, iron 15.  B12 also low normal at 239 and likely contributing to her anemia.  Kidney function, electrolytes, LFTs within normal limits.  Courtney: Please let patient know that I received and reviewed her recent blood work with primary care on 08/04/2022.  Her hemoglobin has improved somewhat, but remains low at 9.7 with iron deficiency as well as very low normal B12 which is also likely contributing to her anemia.  As we discussed, I suspect her persistent iron deficiency is secondary to her inability to absorb iron due to history of gastric bypass.  She needs IV iron.  I would like to refer her to hematology for this, but she requested to discuss further with Dr. Garner Nash before excepting referral.  Has she made a decision on this yet?  Additionally, we can pursue small bowel capsule endoscopy to further evaluate her anemia if she would like.

## 2022-09-16 ENCOUNTER — Other Ambulatory Visit: Payer: Self-pay | Admitting: *Deleted

## 2022-09-16 DIAGNOSIS — D509 Iron deficiency anemia, unspecified: Secondary | ICD-10-CM

## 2022-09-16 NOTE — Telephone Encounter (Signed)
Urgent referral sent

## 2022-09-16 NOTE — Telephone Encounter (Signed)
Noted. Informed daughter. Labs entered into Epic.

## 2022-09-16 NOTE — Telephone Encounter (Signed)
Spoke to pt's daughter Tara Love [facility]) informed her of results and recommendations. She voiced understanding. She would like to go ahead with IV iron. She states her mother is getting weaker. She fell the other night going to the bathroom. Daughter would like you to call her to set up appointments.

## 2022-09-16 NOTE — Telephone Encounter (Signed)
With reports of increasing weakness, lets recheck her hemoglobin. Tried calling daughter but couldn't reach her. Spoke with patient who requested we let her daughter know as she is only takes her to all of her appointments.  Patient also stated she was not interested in capsule endoscopy.  Courtney: Please arrange CBC and let patient's daughter know about this. Dx: IDA  Tammy: Please send referral to hematology for IDA ASAP.

## 2022-09-17 ENCOUNTER — Other Ambulatory Visit (HOSPITAL_COMMUNITY)
Admission: RE | Admit: 2022-09-17 | Discharge: 2022-09-17 | Disposition: A | Discharge status: Home / Self Care | Payer: 59 | Source: Ambulatory Visit | Attending: Gastroenterology | Admitting: Gastroenterology

## 2022-09-17 ENCOUNTER — Other Ambulatory Visit: Payer: Self-pay | Admitting: *Deleted

## 2022-09-17 DIAGNOSIS — D509 Iron deficiency anemia, unspecified: Secondary | ICD-10-CM | POA: Insufficient documentation

## 2022-09-17 LAB — CBC WITH DIFFERENTIAL/PLATELET
Abs Immature Granulocytes: 0.02 10*3/uL (ref 0.00–0.07)
Basophils Absolute: 0 10*3/uL (ref 0.0–0.1)
Basophils Relative: 1 %
Eosinophils Absolute: 0.1 10*3/uL (ref 0.0–0.5)
Eosinophils Relative: 1 %
HCT: 34.3 % — ABNORMAL LOW (ref 36.0–46.0)
Hemoglobin: 9.9 g/dL — ABNORMAL LOW (ref 12.0–15.0)
Immature Granulocytes: 0 %
Lymphocytes Relative: 29 %
Lymphs Abs: 1.7 10*3/uL (ref 0.7–4.0)
MCH: 19.9 pg — ABNORMAL LOW (ref 26.0–34.0)
MCHC: 28.9 g/dL — ABNORMAL LOW (ref 30.0–36.0)
MCV: 68.9 fL — ABNORMAL LOW (ref 80.0–100.0)
Monocytes Absolute: 0.5 10*3/uL (ref 0.1–1.0)
Monocytes Relative: 8 %
Neutro Abs: 3.6 10*3/uL (ref 1.7–7.7)
Neutrophils Relative %: 61 %
Platelets: 390 10*3/uL (ref 150–400)
RBC: 4.98 MIL/uL (ref 3.87–5.11)
RDW: 23.2 % — ABNORMAL HIGH (ref 11.5–15.5)
WBC: 5.8 10*3/uL (ref 4.0–10.5)
nRBC: 0 % (ref 0.0–0.2)

## 2022-09-26 DIAGNOSIS — E440 Moderate protein-calorie malnutrition: Secondary | ICD-10-CM | POA: Diagnosis not present

## 2022-09-29 DIAGNOSIS — R5383 Other fatigue: Secondary | ICD-10-CM | POA: Diagnosis not present

## 2022-09-29 DIAGNOSIS — D649 Anemia, unspecified: Secondary | ICD-10-CM | POA: Diagnosis not present

## 2022-09-29 DIAGNOSIS — D529 Folate deficiency anemia, unspecified: Secondary | ICD-10-CM | POA: Diagnosis not present

## 2022-09-29 DIAGNOSIS — D511 Vitamin B12 deficiency anemia due to selective vitamin B12 malabsorption with proteinuria: Secondary | ICD-10-CM | POA: Diagnosis not present

## 2022-10-05 DIAGNOSIS — J45909 Unspecified asthma, uncomplicated: Secondary | ICD-10-CM | POA: Diagnosis not present

## 2022-10-05 DIAGNOSIS — I700 Atherosclerosis of aorta: Secondary | ICD-10-CM | POA: Diagnosis not present

## 2022-10-05 DIAGNOSIS — R531 Weakness: Secondary | ICD-10-CM | POA: Diagnosis not present

## 2022-10-05 DIAGNOSIS — E860 Dehydration: Secondary | ICD-10-CM | POA: Diagnosis not present

## 2022-10-05 DIAGNOSIS — R109 Unspecified abdominal pain: Secondary | ICD-10-CM | POA: Diagnosis not present

## 2022-10-05 DIAGNOSIS — Z87891 Personal history of nicotine dependence: Secondary | ICD-10-CM | POA: Diagnosis not present

## 2022-10-05 DIAGNOSIS — R1110 Vomiting, unspecified: Secondary | ICD-10-CM | POA: Diagnosis not present

## 2022-10-05 DIAGNOSIS — K2970 Gastritis, unspecified, without bleeding: Secondary | ICD-10-CM | POA: Diagnosis not present

## 2022-10-05 DIAGNOSIS — Z743 Need for continuous supervision: Secondary | ICD-10-CM | POA: Diagnosis not present

## 2022-10-05 DIAGNOSIS — K589 Irritable bowel syndrome without diarrhea: Secondary | ICD-10-CM | POA: Diagnosis not present

## 2022-10-05 DIAGNOSIS — I10 Essential (primary) hypertension: Secondary | ICD-10-CM | POA: Diagnosis not present

## 2022-10-05 DIAGNOSIS — R112 Nausea with vomiting, unspecified: Secondary | ICD-10-CM | POA: Diagnosis not present

## 2022-10-05 DIAGNOSIS — R0689 Other abnormalities of breathing: Secondary | ICD-10-CM | POA: Diagnosis not present

## 2022-10-05 DIAGNOSIS — I2510 Atherosclerotic heart disease of native coronary artery without angina pectoris: Secondary | ICD-10-CM | POA: Diagnosis not present

## 2022-10-05 DIAGNOSIS — Z8673 Personal history of transient ischemic attack (TIA), and cerebral infarction without residual deficits: Secondary | ICD-10-CM | POA: Diagnosis not present

## 2022-10-05 DIAGNOSIS — R6889 Other general symptoms and signs: Secondary | ICD-10-CM | POA: Diagnosis not present

## 2022-10-05 DIAGNOSIS — K2950 Unspecified chronic gastritis without bleeding: Secondary | ICD-10-CM | POA: Diagnosis not present

## 2022-10-05 DIAGNOSIS — E785 Hyperlipidemia, unspecified: Secondary | ICD-10-CM | POA: Diagnosis not present

## 2022-10-17 ENCOUNTER — Inpatient Hospital Stay: Payer: 59

## 2022-10-17 ENCOUNTER — Encounter: Payer: Self-pay | Admitting: Hematology

## 2022-10-17 ENCOUNTER — Inpatient Hospital Stay: Payer: 59 | Attending: Hematology | Admitting: Hematology

## 2022-10-17 VITALS — BP 117/57 | HR 80 | Temp 97.4°F | Resp 16 | Ht 62.0 in | Wt 186.0 lb

## 2022-10-17 DIAGNOSIS — Z7902 Long term (current) use of antithrombotics/antiplatelets: Secondary | ICD-10-CM | POA: Diagnosis not present

## 2022-10-17 DIAGNOSIS — I10 Essential (primary) hypertension: Secondary | ICD-10-CM | POA: Diagnosis not present

## 2022-10-17 DIAGNOSIS — G309 Alzheimer's disease, unspecified: Secondary | ICD-10-CM | POA: Diagnosis not present

## 2022-10-17 DIAGNOSIS — D509 Iron deficiency anemia, unspecified: Secondary | ICD-10-CM | POA: Diagnosis not present

## 2022-10-17 DIAGNOSIS — Z801 Family history of malignant neoplasm of trachea, bronchus and lung: Secondary | ICD-10-CM | POA: Insufficient documentation

## 2022-10-17 DIAGNOSIS — Z87891 Personal history of nicotine dependence: Secondary | ICD-10-CM

## 2022-10-17 DIAGNOSIS — R030 Elevated blood-pressure reading, without diagnosis of hypertension: Secondary | ICD-10-CM | POA: Diagnosis not present

## 2022-10-17 DIAGNOSIS — Z800 Family history of malignant neoplasm of digestive organs: Secondary | ICD-10-CM | POA: Diagnosis not present

## 2022-10-17 DIAGNOSIS — Z955 Presence of coronary angioplasty implant and graft: Secondary | ICD-10-CM | POA: Diagnosis not present

## 2022-10-17 DIAGNOSIS — I2510 Atherosclerotic heart disease of native coronary artery without angina pectoris: Secondary | ICD-10-CM | POA: Diagnosis not present

## 2022-10-17 DIAGNOSIS — Z9884 Bariatric surgery status: Secondary | ICD-10-CM | POA: Diagnosis not present

## 2022-10-17 DIAGNOSIS — R634 Abnormal weight loss: Secondary | ICD-10-CM | POA: Diagnosis not present

## 2022-10-17 DIAGNOSIS — Z79899 Other long term (current) drug therapy: Secondary | ICD-10-CM | POA: Diagnosis not present

## 2022-10-17 DIAGNOSIS — E440 Moderate protein-calorie malnutrition: Secondary | ICD-10-CM | POA: Diagnosis not present

## 2022-10-17 DIAGNOSIS — Z807 Family history of other malignant neoplasms of lymphoid, hematopoietic and related tissues: Secondary | ICD-10-CM

## 2022-10-17 DIAGNOSIS — E782 Mixed hyperlipidemia: Secondary | ICD-10-CM | POA: Diagnosis not present

## 2022-10-17 LAB — FERRITIN: Ferritin: 6 ng/mL — ABNORMAL LOW (ref 11–307)

## 2022-10-17 LAB — COMPREHENSIVE METABOLIC PANEL
ALT: 11 U/L (ref 0–44)
AST: 24 U/L (ref 15–41)
Albumin: 3.6 g/dL (ref 3.5–5.0)
Alkaline Phosphatase: 70 U/L (ref 38–126)
Anion gap: 12 (ref 5–15)
BUN: 10 mg/dL (ref 8–23)
CO2: 28 mmol/L (ref 22–32)
Calcium: 8.8 mg/dL — ABNORMAL LOW (ref 8.9–10.3)
Chloride: 97 mmol/L — ABNORMAL LOW (ref 98–111)
Creatinine, Ser: 1.09 mg/dL — ABNORMAL HIGH (ref 0.44–1.00)
GFR, Estimated: 51 mL/min — ABNORMAL LOW (ref >60)
Glucose, Bld: 85 mg/dL (ref 70–99)
Potassium: 3.4 mmol/L — ABNORMAL LOW (ref 3.5–5.1)
Sodium: 137 mmol/L (ref 135–145)
Total Bilirubin: 0.7 mg/dL (ref 0.3–1.2)
Total Protein: 7.1 g/dL (ref 6.5–8.1)

## 2022-10-17 LAB — CBC WITH DIFFERENTIAL/PLATELET
Abs Immature Granulocytes: 0.03 10*3/uL (ref 0.00–0.07)
Basophils Absolute: 0.1 10*3/uL (ref 0.0–0.1)
Basophils Relative: 1 %
Eosinophils Absolute: 0.2 10*3/uL (ref 0.0–0.5)
Eosinophils Relative: 2 %
HCT: 41.1 % (ref 36.0–46.0)
Hemoglobin: 11.9 g/dL — ABNORMAL LOW (ref 12.0–15.0)
Immature Granulocytes: 0 %
Lymphocytes Relative: 46 %
Lymphs Abs: 3.1 10*3/uL (ref 0.7–4.0)
MCH: 21.6 pg — ABNORMAL LOW (ref 26.0–34.0)
MCHC: 29 g/dL — ABNORMAL LOW (ref 30.0–36.0)
MCV: 74.5 fL — ABNORMAL LOW (ref 80.0–100.0)
Monocytes Absolute: 0.5 10*3/uL (ref 0.1–1.0)
Monocytes Relative: 7 %
Neutro Abs: 3.1 10*3/uL (ref 1.7–7.7)
Neutrophils Relative %: 44 %
Platelets: 389 10*3/uL (ref 150–400)
RBC: 5.52 MIL/uL — ABNORMAL HIGH (ref 3.87–5.11)
RDW: 24.7 % — ABNORMAL HIGH (ref 11.5–15.5)
WBC: 6.9 10*3/uL (ref 4.0–10.5)
nRBC: 0 % (ref 0.0–0.2)

## 2022-10-17 LAB — IRON AND TIBC
Iron: 21 ug/dL — ABNORMAL LOW (ref 28–170)
Saturation Ratios: 5 % — ABNORMAL LOW (ref 10.4–31.8)
TIBC: 431 ug/dL (ref 250–450)
UIBC: 410 ug/dL

## 2022-10-17 LAB — LACTATE DEHYDROGENASE: LDH: 151 U/L (ref 98–192)

## 2022-10-17 LAB — FOLATE: Folate: 10.6 ng/mL (ref >5.9)

## 2022-10-17 LAB — DIRECT ANTIGLOBULIN TEST (NOT AT ARMC)
DAT, IgG: NEGATIVE
DAT, complement: NEGATIVE

## 2022-10-17 LAB — RETICULOCYTES
Immature Retic Fract: 35.4 % — ABNORMAL HIGH (ref 2.3–15.9)
RBC.: 5.49 MIL/uL — ABNORMAL HIGH (ref 3.87–5.11)
Retic Count, Absolute: 102.1 10*3/uL (ref 19.0–186.0)
Retic Ct Pct: 1.9 % (ref 0.4–3.1)

## 2022-10-17 LAB — VITAMIN B12: Vitamin B-12: 379 pg/mL (ref 180–914)

## 2022-10-17 NOTE — Patient Instructions (Signed)
[facility]  Discharge Instructions  You were seen and examined today by Dr. Katragadda. Dr. Katragadda is a hematologist, meaning that he specializes in blood abnormalities. Dr. Katragadda discussed your past medical history, family history of cancers/blood conditions and the events that led to you being here today.  You were referred to Dr. Katragadda due to anemia.  Dr. Katragadda has recommended additional labs today for further evaluation.  Follow-up as scheduled.  Thank you for choosing [facility] to provide your oncology and hematology care.   To afford each patient quality time with our provider, please arrive at least 15 minutes before your scheduled appointment time. You may need to reschedule your appointment if you arrive late (10 or more minutes). Arriving late affects you and other patients whose appointments are after yours.  Also, if you miss three or more appointments without notifying the office, you may be dismissed from the clinic at the provider's discretion.    Again, thank you for choosing [facility].  Our hope is that these requests will decrease the amount of time that you wait before being seen by our physicians.   If you have a lab appointment with the Cancer Center - please note that after April 8th, all labs will be drawn in the cancer center.  You do not have to check in or register with the main entrance as you have in the past but will complete your check-in at the cancer center.            _____________________________________________________________  Should you have questions after your visit to [facility], please contact our office at [phone] and follow the prompts.  Our office hours are 8:00 a.m. to 4:30 p.m. Monday - Thursday and 8:00 a.m. to 2:30 p.m. Friday.  Please note that voicemails left after 4:00 p.m. may not be returned until the following business day.  We are  closed weekends and all major holidays.  You do have access to a nurse 24-7, just call the main number to the clinic [phone] and do not press any options, hold on the line and a nurse will answer the phone.    For prescription refill requests, have your pharmacy contact our office and allow 72 hours.    Masks are no longer required in the cancer centers. If you would like for your care team to wear a mask while they are taking care of you, please let them know. You may have one support person who is at least 83 years old accompany you for your appointments.  

## 2022-10-17 NOTE — Progress Notes (Signed)
CONSULT NOTE  Patient Care Team: Richardean Chimera, MD as PCP - General (Unknown Physician Specialty) Drema Dallas, DO as Consulting Physician (Neurology) Doreatha Massed, MD as Medical Oncologist (Hematology)  CHIEF COMPLAINTS/PURPOSE OF CONSULTATION:  Severe microcytic anemia.  HISTORY OF PRESENTING ILLNESS:  Tara Love May 82 y.o. female is seen in consultation today for further workup and management of microcytic anemia.  She had gastric bypass surgery approximately 30 years ago.  She had CAD, status post stent in 2009 on Plavix.  Had hematemesis in June 2023, CT imaging showed hematoma in the excluded segment of the post gastric bypass stomach and June 2023.  She had to receive 1 unit PRBC and Plavix was discontinued.  She had EGD and colonoscopy on 06/05/2022 which was essentially normal.  However her stool for fecal occult blood was positive.  She recently went to the ER at [facility] and was found to have hemoglobin of 9.1.  She denies any ice pica.  No prior history of iron infusions.  She has been taking iron tablet daily for the last several months.    MEDICAL HISTORY:  Past Medical History:  Diagnosis Date   Asthma    CAD (coronary artery disease)    a. DES to diag 06/2007. b. Cath 01/2013 due to abnormal nuc at [facility]: nonobst dz, patent stent, EF 55-65%.   Complication of anesthesia    Iron deficiency anemia, unspecified    Irritable bowel syndrome    Morbid obesity (HCC)    Other and unspecified hyperlipidemia    PONV (postoperative nausea and vomiting)    Sleep apnea    Unspecified asthma(493.90)    Unspecified essential hypertension     SURGICAL HISTORY: Past Surgical History:  Procedure Laterality Date   BIOPSY  10/18/2019   Procedure: BIOPSY;  Surgeon: Ovidio Kin, MD;  Location: [facility];  Service: General;;   CARPAL TUNNEL RELEASE     Bilateral   CATARACT EXTRACTION W/PHACO Left 07/14/2016   Procedure: CATARACT EXTRACTION PHACO AND  INTRAOCULAR LENS PLACEMENT (IOC);  Surgeon: Gemma Payor, MD;  Location: [facility];  Service: Ophthalmology;  Laterality: Left;  CDE: 7.04   CATARACT EXTRACTION W/PHACO Right 08/11/2016   Procedure: CATARACT EXTRACTION PHACO AND INTRAOCULAR LENS PLACEMENT (IOC);  Surgeon: Gemma Payor, MD;  Location: [facility];  Service: Ophthalmology;  Laterality: Right;  CDE: 11.58   CHOLECYSTECTOMY OPEN  1987   COLONOSCOPY WITH PROPOFOL N/A 06/05/2022   Procedure: COLONOSCOPY WITH PROPOFOL;  Surgeon: Lanelle Bal, DO;  Location: [facility];  Service: Endoscopy;  Laterality: N/A;  9:00 am, pt knows to arrive at 8:45   DILATION AND CURETTAGE OF UTERUS     ESOPHAGOGASTRODUODENOSCOPY (EGD) WITH PROPOFOL N/A 10/18/2019   Procedure: ESOPHAGOGASTRODUODENOSCOPY (EGD) WITH PROPOFOL;  Surgeon: Ovidio Kin, MD;  Location: [facility];  Service: General;  Laterality: N/A;   ESOPHAGOGASTRODUODENOSCOPY (EGD) WITH PROPOFOL N/A 06/05/2022   Procedure: ESOPHAGOGASTRODUODENOSCOPY (EGD) WITH PROPOFOL;  Surgeon: Lanelle Bal, DO;  [facility];  Service: Endoscopy;  Laterality: N/A;   GASTRIC BYPASS SURGERY  1993   Subtotal gastrectomy   INCISIONAL HERNIA REPAIR  01/25/2003   with mesh   LEFT HEART CATHETERIZATION WITH CORONARY ANGIOGRAM N/A 02/15/2013   Procedure: LEFT HEART CATHETERIZATION WITH CORONARY ANGIOGRAM;  Surgeon: Peter M Swaziland, MD;  Location: [facility] C[facility] LAB;  Service: Cardiovascular;  Laterality: N/A;   TONSILLECTOMY     as a child    SOCIAL HISTORY: Social History   Socioeconomic History  Marital status: Divorced    Spouse name: Not on file   Number of children: 2   Years of education: Not on file   Highest education level: Not on file  Occupational History   Occupation: RETIRED  Tobacco Use   Smoking status: Former    Packs/day: 2.00    Years: 18.00    Additional pack years: 0.00    Total pack years: 36.00    Types: Cigarettes    Start date: 05/20/1947    Quit date: 05/19/1965    Years  since quitting: 57.4   Smokeless tobacco: Former    Types: Snuff, Chew    Quit date: 05/19/1946   Tobacco comments:    Started smoking at age 61-has not smoked in 101 years/dipped snuff and chewed tobacco during childhood  Vaping Use   Vaping Use: Never used  Substance and Sexual Activity   Alcohol use: No    Alcohol/week: 0.0 standard drinks of alcohol   Drug use: No   Sexual activity: Not Currently  Other Topics Concern   Not on file  Social History Narrative   Right handed   One story home   Drinks caffeine, usually diet coke or tea   Social Determinants of Health   Financial Resource Strain: Not on file  Food Insecurity: No Food Insecurity (10/17/2022)   Hunger Vital Sign    Worried About Running Out of Food in the Last Year: Never true    Ran Out of Food in the Last Year: Never true  Transportation Needs: No Transportation Needs (10/17/2022)   PRAPARE - Administrator, Civil Service (Medical): No    Lack of Transportation (Non-Medical): No  Physical Activity: Not on file  Stress: Not on file  Social Connections: Not on file  Intimate Partner Violence: Not At Risk (10/17/2022)   Humiliation, Afraid, Rape, and Kick questionnaire    Fear of Current or Ex-Partner: No    Emotionally Abused: No    Physically Abused: No    Sexually Abused: No    FAMILY HISTORY: Family History  Problem Relation Age of Onset   Hernia Mother 27   Cancer Father        Prostate & Hx of CHF, ?colon cancer too    ALLERGIES:  is allergic to codeine phosphate and morphine sulfate.  MEDICATIONS:  Current Outpatient Medications  Medication Sig Dispense Refill   acetaminophen (TYLENOL) 500 MG tablet Take 1,000-1,500 mg by mouth 2 (two) times daily as needed for moderate pain or headache.      albuterol (PROVENTIL) (2.5 MG/3ML) 0.083% nebulizer solution Take 2.5 mg by nebulization every 4 (four) hours as needed for shortness of breath or wheezing.     albuterol (VENTOLIN HFA) 108 (90  Base) MCG/ACT inhaler Inhale 2 puffs into the lungs every 4 (four) hours as needed for wheezing or shortness of breath.     ferrous sulfate 325 (65 FE) MG tablet Take 325 mg by mouth daily with breakfast.     isosorbide mononitrate (IMDUR) 60 MG 24 hr tablet Take 1 tablet (60 mg total) by mouth daily. 30 tablet 6   metoprolol succinate (TOPROL-XL) 25 MG 24 hr tablet Take 1 tablet by mouth once daily 30 tablet 0   mirtazapine (REMERON) 15 MG tablet Take 30 mg by mouth at bedtime.     nitroGLYCERIN (NITROSTAT) 0.4 MG SL tablet Place 1 tablet (0.4 mg total) under the tongue every 5 (five) minutes as needed for chest pain (up to  3 doses).     pantoprazole (PROTONIX) 40 MG tablet Take 1 tablet (40 mg total) by mouth daily before breakfast. 30 tablet 3   ranolazine (RANEXA) 1000 MG SR tablet TAKE 1 TABLET BY MOUTH TWICE DAILY 60 tablet 6   simvastatin (ZOCOR) 40 MG tablet Take 40 mg by mouth at bedtime.     ondansetron (ZOFRAN) 4 MG tablet Take 4 mg by mouth every 8 (eight) hours as needed for nausea or vomiting. (Patient not taking: Reported on 10/17/2022)     No current facility-administered medications for this visit.    REVIEW OF SYSTEMS:   Constitutional: Denies fevers, chills or abnormal night sweats Eyes: Denies blurriness of vision, double vision or watery eyes Ears, nose, mouth, throat, and face: Denies mucositis or sore throat Respiratory: Denies cough, dyspnea or wheezes Cardiovascular: Denies palpitation, chest discomfort or lower extremity swelling Gastrointestinal: Positive for constipation and occasional nausea. Skin: Denies abnormal skin rashes Lymphatics: Denies new lymphadenopathy or easy bruising Neurological:Denies numbness, tingling or new weaknesses Behavioral/Psych: Mood is stable, no new changes  All other systems were reviewed with the patient and are negative.  PHYSICAL EXAMINATION: ECOG PERFORMANCE STATUS: 2 - Symptomatic, <50% confined to bed  Vitals:   10/17/22  1311  BP: (!) 117/57  Pulse: 80  Resp: 16  Temp: (!) 97.4 F (36.3 C)  SpO2: 98%   Filed Weights   10/17/22 1311  Weight: 186 lb (84.4 kg)    GENERAL:alert, no distress and comfortable SKIN: skin color, texture, turgor are normal, no rashes or significant lesions EYES: normal, conjunctiva are pink and non-injected, sclera clear OROPHARYNX:no exudate, no erythema and lips, buccal mucosa, and tongue normal  NECK: supple, thyroid normal size, non-tender, without nodularity LYMPH:  no palpable lymphadenopathy in the cervical, axillary or inguinal LUNGS: clear to auscultation and percussion with normal breathing effort HEART: regular rate & rhythm and no murmurs and no lower extremity edema ABDOMEN:abdomen soft, non-tender and normal bowel sounds Musculoskeletal:no cyanosis of digits and no clubbing  PSYCH: alert & oriented x 3 with fluent speech NEURO: no focal motor/sensory deficits  LABORATORY DATA:  I have reviewed the data as listed Recent Results (from the past 2160 hour(s))  CBC with Differential/Platelet     Status: Abnormal   Collection Time: 09/17/22 12:41 PM  Result Value Ref Range   WBC 5.8 4.0 - 10.5 K/uL   RBC 4.98 3.87 - 5.11 MIL/uL   Hemoglobin 9.9 (L) 12.0 - 15.0 g/dL   HCT 09.8 (L) 11.9 - 14.7 %   MCV 68.9 (L) 80.0 - 100.0 fL   MCH 19.9 (L) 26.0 - 34.0 pg   MCHC 28.9 (L) 30.0 - 36.0 g/dL   RDW 82.9 (H) 56.2 - 13.0 %   Platelets 390 150 - 400 K/uL   nRBC 0.0 0.0 - 0.2 %   Neutrophils Relative % 61 %   Neutro Abs 3.6 1.7 - 7.7 K/uL   Lymphocytes Relative 29 %   Lymphs Abs 1.7 0.7 - 4.0 K/uL   Monocytes Relative 8 %   Monocytes Absolute 0.5 0.1 - 1.0 K/uL   Eosinophils Relative 1 %   Eosinophils Absolute 0.1 0.0 - 0.5 K/uL   Basophils Relative 1 %   Basophils Absolute 0.0 0.0 - 0.1 K/uL   WBC Morphology MORPHOLOGY UNREMARKABLE    RBC Morphology See Note     Comment: ANISOCYTOSIS AND MICROCYTOSIS   Smear Review MORPHOLOGY UNREMARKABLE    Immature  Granulocytes 0 %   Abs  Immature Granulocytes 0.02 0.00 - 0.07 K/uL   Ovalocytes PRESENT     Comment: Performed at [facility], [address] [place], [place] [address]  Direct antiglobulin test     Status: None (Preliminary result)   Collection Time: 10/17/22  1:39 PM  Result Value Ref Range   DAT, complement PENDING    DAT, IgG      NEG Performed at [facility], [address] [place], [place] [address]   Lactate dehydrogenase     Status: None   Collection Time: 10/17/22  1:39 PM  Result Value Ref Range   LDH 151 98 - 192 U/L    Comment: Performed at [facility], [address], [place], [place] [address]  Comprehensive metabolic panel     Status: Abnormal   Collection Time: 10/17/22  1:39 PM  Result Value Ref Range   Sodium 137 135 - 145 mmol/L   Potassium 3.4 (L) 3.5 - 5.1 mmol/L   Chloride 97 (L) 98 - 111 mmol/L   CO2 28 22 - 32 mmol/L   Glucose, Bld 85 70 - 99 mg/dL    Comment: Glucose reference range applies only to samples taken after fasting for at least 8 hours.   BUN 10 8 - 23 mg/dL   Creatinine, Ser 9.14 (H) 0.44 - 1.00 mg/dL   Calcium 8.8 (L) 8.9 - 10.3 mg/dL   Total Protein 7.1 6.5 - 8.1 g/dL   Albumin 3.6 3.5 - 5.0 g/dL   AST 24 15 - 41 U/L   ALT 11 0 - 44 U/L   Alkaline Phosphatase 70 38 - 126 U/L   Total Bilirubin 0.7 0.3 - 1.2 mg/dL   GFR, Estimated 51 (L) >60 mL/min    Comment: (NOTE) Calculated using the CKD-EPI Creatinine Equation (2021)    Anion gap 12 5 - 15    Comment: Performed at [facility], [address] [place], [place] [address]  CBC with Differential     Status: Abnormal (Preliminary result)   Collection Time: 10/17/22  1:39 PM  Result Value Ref Range   WBC 6.9 4.0 - 10.5 K/uL   RBC 5.52 (H) 3.87 - 5.11 MIL/uL   Hemoglobin 11.9 (L) 12.0 - 15.0 g/dL   HCT 62.1 30.8 - 65.7 %   MCV 74.5 (L) 80.0 - 100.0 fL   MCH 21.6 (L) 26.0 - 34.0 pg   MCHC 29.0 (L) 30.0 - 36.0 g/dL   RDW 84.6 (H) 96.2 - 95.2 %   Platelets 389 150 - 400 K/uL    nRBC 0.0 0.0 - 0.2 %    Comment: Performed at [facility], [address] [place], [place] [address]   Neutrophils Relative % PENDING %   Neutro Abs PENDING 1.7 - 7.7 K/uL   Band Neutrophils PENDING %   Lymphocytes Relative PENDING %   Lymphs Abs PENDING 0.7 - 4.0 K/uL   Monocytes Relative PENDING %   Monocytes Absolute PENDING 0.1 - 1.0 K/uL   Eosinophils Relative PENDING %   Eosinophils Absolute PENDING 0.0 - 0.5 K/uL   Basophils Relative PENDING %   Basophils Absolute PENDING 0.0 - 0.1 K/uL   WBC Morphology PENDING    RBC Morphology PENDING    Smear Review PENDING    Other PENDING %   nRBC PENDING 0 /100 WBC   Metamyelocytes Relative PENDING %   Myelocytes PENDING %   Promyelocytes Relative PENDING %   Blasts PENDING %   Immature Granulocytes PENDING %  Abs Immature Granulocytes PENDING 0.00 - 0.07 K/uL  Reticulocytes     Status: Abnormal   Collection Time: 10/17/22  1:40 PM  Result Value Ref Range   Retic Ct Pct 1.9 0.4 - 3.1 %   RBC. 5.49 (H) 3.87 - 5.11 MIL/uL   Retic Count, Absolute 102.1 19.0 - 186.0 K/uL   Immature Retic Fract 35.4 (H) 2.3 - 15.9 %    Comment: Performed at [facility], [address], [place], [place] [address]    RADIOGRAPHIC STUDIES: I have personally reviewed the radiological images as listed and agreed with the findings in the report. No results found.  ASSESSMENT:  1.  Severe microcytic anemia: - History of gastric bypass surgery 30 years ago - 10/2021-hematemesis, CT scan noting large hematoma 18.9 x 7.5 cm in the excluded segment of post gastric bypass stomach.  EGD 10/2021 at [facility] showed normal post Roux-en-Y bypass anatomy.  Small dimples noted in the gastric body but no obvious ulcer or fistulous tracts present.  She required 1 unit PRBC.  Plavix was discontinued. - Colonoscopy (06/05/2022): Nonbleeding internal hemorrhoids, otherwise normal exam. - EGD (06/05/2022): Normal esophagus, Roux-en-Y gastrojejunostomy with GJ  anastomosis characterized by healthy-appearing mucosa, normal examined jejunum. - CBC (10/05/2022): Hb-9.1, MCV-71 - She is taking iron tablet daily for past several months.  Denies ice pica. - Daughter reports 40 pound weight loss since December due to decreased appetite.  2.  Social/family history: - She lives in an apartment across the street from her daughter Judieth Keens who is with her today.  She has memory problems.  She is independent of ADLs and some IADLs. - Mother had stomach cancer.  Father had lung cancer.  Maternal grandfather had Hodgkin's disease.  Some maternal and paternal aunts had cancer.   PLAN:  1.  Severe microcytic anemia: - This is likely consistent with iron deficiency state. - Will repeat CBC today.  Will check for nutritional deficiencies, hemolysis, and bone marrow infiltrative process. - If she is confirmed to have iron deficiency, we talked about parenteral iron therapy with Feraheme weekly x 2. - We discussed rare possibility of anaphylactic reactions.      All questions were answered. The patient knows to call the clinic with any problems, questions or concerns.      Doreatha Massed, MD 10/17/22 2:27 PM

## 2022-10-19 LAB — HAPTOGLOBIN: Haptoglobin: 242 mg/dL (ref 41–333)

## 2022-10-20 LAB — KAPPA/LAMBDA LIGHT CHAINS
Kappa free light chain: 35.7 mg/L — ABNORMAL HIGH (ref 3.3–19.4)
Kappa, lambda light chain ratio: 2.26 — ABNORMAL HIGH (ref 0.26–1.65)
Lambda free light chains: 15.8 mg/L (ref 5.7–26.3)

## 2022-10-20 LAB — METHYLMALONIC ACID, SERUM: Methylmalonic Acid, Quantitative: 251 nmol/L (ref 0–378)

## 2022-10-21 LAB — COPPER, SERUM: Copper: 124 ug/dL (ref 80–158)

## 2022-10-22 LAB — PROTEIN ELECTROPHORESIS, SERUM
A/G Ratio: 1 (ref 0.7–1.7)
Albumin ELP: 3.2 g/dL (ref 2.9–4.4)
Alpha-1-Globulin: 0.3 g/dL (ref 0.0–0.4)
Alpha-2-Globulin: 0.9 g/dL (ref 0.4–1.0)
Beta Globulin: 1 g/dL (ref 0.7–1.3)
Gamma Globulin: 1.1 g/dL (ref 0.4–1.8)
Globulin, Total: 3.2 g/dL (ref 2.2–3.9)
Total Protein ELP: 6.4 g/dL (ref 6.0–8.5)

## 2022-10-23 DIAGNOSIS — I639 Cerebral infarction, unspecified: Secondary | ICD-10-CM | POA: Diagnosis not present

## 2022-10-23 DIAGNOSIS — I6782 Cerebral ischemia: Secondary | ICD-10-CM | POA: Diagnosis not present

## 2022-10-23 DIAGNOSIS — R4781 Slurred speech: Secondary | ICD-10-CM | POA: Diagnosis not present

## 2022-10-23 DIAGNOSIS — I6523 Occlusion and stenosis of bilateral carotid arteries: Secondary | ICD-10-CM | POA: Diagnosis not present

## 2022-10-23 DIAGNOSIS — E785 Hyperlipidemia, unspecified: Secondary | ICD-10-CM | POA: Diagnosis not present

## 2022-10-23 DIAGNOSIS — I10 Essential (primary) hypertension: Secondary | ICD-10-CM | POA: Diagnosis not present

## 2022-10-23 DIAGNOSIS — I2510 Atherosclerotic heart disease of native coronary artery without angina pectoris: Secondary | ICD-10-CM | POA: Diagnosis not present

## 2022-10-23 DIAGNOSIS — Z743 Need for continuous supervision: Secondary | ICD-10-CM | POA: Diagnosis not present

## 2022-10-23 DIAGNOSIS — R531 Weakness: Secondary | ICD-10-CM | POA: Diagnosis not present

## 2022-10-23 DIAGNOSIS — Z885 Allergy status to narcotic agent status: Secondary | ICD-10-CM | POA: Diagnosis not present

## 2022-10-23 DIAGNOSIS — Z79899 Other long term (current) drug therapy: Secondary | ICD-10-CM | POA: Diagnosis not present

## 2022-10-23 DIAGNOSIS — Z87891 Personal history of nicotine dependence: Secondary | ICD-10-CM | POA: Diagnosis not present

## 2022-10-24 DIAGNOSIS — R531 Weakness: Secondary | ICD-10-CM | POA: Diagnosis not present

## 2022-10-24 DIAGNOSIS — R634 Abnormal weight loss: Secondary | ICD-10-CM | POA: Diagnosis not present

## 2022-10-24 DIAGNOSIS — Z9282 Status post administration of tPA (rtPA) in a different facility within the last 24 hours prior to admission to current facility: Secondary | ICD-10-CM | POA: Diagnosis not present

## 2022-10-24 DIAGNOSIS — R9082 White matter disease, unspecified: Secondary | ICD-10-CM | POA: Diagnosis not present

## 2022-10-24 DIAGNOSIS — Z955 Presence of coronary angioplasty implant and graft: Secondary | ICD-10-CM | POA: Diagnosis not present

## 2022-10-24 DIAGNOSIS — M62521 Muscle wasting and atrophy, not elsewhere classified, right upper arm: Secondary | ICD-10-CM | POA: Diagnosis not present

## 2022-10-24 DIAGNOSIS — R2689 Other abnormalities of gait and mobility: Secondary | ICD-10-CM | POA: Diagnosis not present

## 2022-10-24 DIAGNOSIS — N2889 Other specified disorders of kidney and ureter: Secondary | ICD-10-CM | POA: Diagnosis not present

## 2022-10-24 DIAGNOSIS — Z885 Allergy status to narcotic agent status: Secondary | ICD-10-CM | POA: Diagnosis not present

## 2022-10-24 DIAGNOSIS — M62522 Muscle wasting and atrophy, not elsewhere classified, left upper arm: Secondary | ICD-10-CM | POA: Diagnosis not present

## 2022-10-24 DIAGNOSIS — I2510 Atherosclerotic heart disease of native coronary artery without angina pectoris: Secondary | ICD-10-CM | POA: Diagnosis not present

## 2022-10-24 DIAGNOSIS — G319 Degenerative disease of nervous system, unspecified: Secondary | ICD-10-CM | POA: Diagnosis not present

## 2022-10-24 DIAGNOSIS — Z9989 Dependence on other enabling machines and devices: Secondary | ICD-10-CM | POA: Diagnosis not present

## 2022-10-24 DIAGNOSIS — R29898 Other symptoms and signs involving the musculoskeletal system: Secondary | ICD-10-CM | POA: Diagnosis not present

## 2022-10-24 DIAGNOSIS — R29701 NIHSS score 1: Secondary | ICD-10-CM | POA: Diagnosis not present

## 2022-10-24 DIAGNOSIS — F03A0 Unspecified dementia, mild, without behavioral disturbance, psychotic disturbance, mood disturbance, and anxiety: Secondary | ICD-10-CM | POA: Diagnosis not present

## 2022-10-24 DIAGNOSIS — Z79899 Other long term (current) drug therapy: Secondary | ICD-10-CM | POA: Diagnosis not present

## 2022-10-24 DIAGNOSIS — Z87891 Personal history of nicotine dependence: Secondary | ICD-10-CM | POA: Diagnosis not present

## 2022-10-24 DIAGNOSIS — I639 Cerebral infarction, unspecified: Secondary | ICD-10-CM | POA: Diagnosis not present

## 2022-10-24 DIAGNOSIS — I25119 Atherosclerotic heart disease of native coronary artery with unspecified angina pectoris: Secondary | ICD-10-CM | POA: Diagnosis not present

## 2022-10-24 DIAGNOSIS — I6389 Other cerebral infarction: Secondary | ICD-10-CM | POA: Diagnosis not present

## 2022-10-24 DIAGNOSIS — M6281 Muscle weakness (generalized): Secondary | ICD-10-CM | POA: Diagnosis not present

## 2022-10-24 DIAGNOSIS — I080 Rheumatic disorders of both mitral and aortic valves: Secondary | ICD-10-CM | POA: Diagnosis not present

## 2022-10-24 DIAGNOSIS — D649 Anemia, unspecified: Secondary | ICD-10-CM | POA: Diagnosis not present

## 2022-10-24 DIAGNOSIS — E785 Hyperlipidemia, unspecified: Secondary | ICD-10-CM | POA: Diagnosis not present

## 2022-10-24 DIAGNOSIS — I10 Essential (primary) hypertension: Secondary | ICD-10-CM | POA: Diagnosis not present

## 2022-10-24 DIAGNOSIS — N3289 Other specified disorders of bladder: Secondary | ICD-10-CM | POA: Diagnosis not present

## 2022-10-24 DIAGNOSIS — R569 Unspecified convulsions: Secondary | ICD-10-CM | POA: Diagnosis not present

## 2022-10-24 DIAGNOSIS — K5730 Diverticulosis of large intestine without perforation or abscess without bleeding: Secondary | ICD-10-CM | POA: Diagnosis not present

## 2022-10-24 DIAGNOSIS — R29703 NIHSS score 3: Secondary | ICD-10-CM | POA: Diagnosis not present

## 2022-10-24 DIAGNOSIS — G4733 Obstructive sleep apnea (adult) (pediatric): Secondary | ICD-10-CM | POA: Diagnosis not present

## 2022-10-24 DIAGNOSIS — Z7409 Other reduced mobility: Secondary | ICD-10-CM | POA: Diagnosis not present

## 2022-10-24 LAB — IMMUNOFIXATION ELECTROPHORESIS
IgA: 201 mg/dL (ref 64–422)
IgG (Immunoglobin G), Serum: 1091 mg/dL (ref 586–1602)
IgM (Immunoglobulin M), Srm: 53 mg/dL (ref 26–217)
Total Protein ELP: 6.3 g/dL (ref 6.0–8.5)

## 2022-10-25 DIAGNOSIS — I639 Cerebral infarction, unspecified: Secondary | ICD-10-CM | POA: Diagnosis not present

## 2022-10-26 DIAGNOSIS — I639 Cerebral infarction, unspecified: Secondary | ICD-10-CM | POA: Diagnosis not present

## 2022-10-28 ENCOUNTER — Other Ambulatory Visit: Payer: Self-pay | Admitting: Physician Assistant

## 2022-10-28 ENCOUNTER — Ambulatory Visit: Payer: 59 | Admitting: Oncology

## 2022-10-28 DIAGNOSIS — D509 Iron deficiency anemia, unspecified: Secondary | ICD-10-CM

## 2022-10-29 ENCOUNTER — Inpatient Hospital Stay: Payer: 59

## 2022-10-29 ENCOUNTER — Inpatient Hospital Stay: Payer: 59 | Admitting: Physician Assistant

## 2022-10-29 DIAGNOSIS — I2510 Atherosclerotic heart disease of native coronary artery without angina pectoris: Secondary | ICD-10-CM | POA: Diagnosis not present

## 2022-10-29 DIAGNOSIS — M6281 Muscle weakness (generalized): Secondary | ICD-10-CM | POA: Diagnosis not present

## 2022-10-29 DIAGNOSIS — M62521 Muscle wasting and atrophy, not elsewhere classified, right upper arm: Secondary | ICD-10-CM | POA: Diagnosis not present

## 2022-10-29 DIAGNOSIS — E785 Hyperlipidemia, unspecified: Secondary | ICD-10-CM | POA: Diagnosis not present

## 2022-10-29 DIAGNOSIS — Z9989 Dependence on other enabling machines and devices: Secondary | ICD-10-CM | POA: Diagnosis not present

## 2022-10-29 DIAGNOSIS — I639 Cerebral infarction, unspecified: Secondary | ICD-10-CM | POA: Diagnosis not present

## 2022-10-29 DIAGNOSIS — R634 Abnormal weight loss: Secondary | ICD-10-CM | POA: Diagnosis not present

## 2022-10-29 DIAGNOSIS — G4733 Obstructive sleep apnea (adult) (pediatric): Secondary | ICD-10-CM | POA: Diagnosis not present

## 2022-10-29 DIAGNOSIS — R2689 Other abnormalities of gait and mobility: Secondary | ICD-10-CM | POA: Diagnosis not present

## 2022-10-29 DIAGNOSIS — I10 Essential (primary) hypertension: Secondary | ICD-10-CM | POA: Diagnosis not present

## 2022-10-29 DIAGNOSIS — D509 Iron deficiency anemia, unspecified: Secondary | ICD-10-CM | POA: Diagnosis not present

## 2022-10-29 DIAGNOSIS — G309 Alzheimer's disease, unspecified: Secondary | ICD-10-CM | POA: Diagnosis not present

## 2022-10-29 DIAGNOSIS — Z955 Presence of coronary angioplasty implant and graft: Secondary | ICD-10-CM | POA: Diagnosis not present

## 2022-10-29 DIAGNOSIS — M62522 Muscle wasting and atrophy, not elsewhere classified, left upper arm: Secondary | ICD-10-CM | POA: Diagnosis not present

## 2022-10-30 ENCOUNTER — Encounter: Payer: Self-pay | Admitting: Gastroenterology

## 2022-10-30 DIAGNOSIS — I10 Essential (primary) hypertension: Secondary | ICD-10-CM | POA: Diagnosis not present

## 2022-10-30 DIAGNOSIS — I639 Cerebral infarction, unspecified: Secondary | ICD-10-CM | POA: Diagnosis not present

## 2022-10-30 DIAGNOSIS — G309 Alzheimer's disease, unspecified: Secondary | ICD-10-CM | POA: Diagnosis not present

## 2022-11-07 ENCOUNTER — Inpatient Hospital Stay: Payer: 59 | Attending: Hematology

## 2022-11-07 ENCOUNTER — Other Ambulatory Visit: Payer: Self-pay | Admitting: *Deleted

## 2022-11-07 VITALS — BP 100/64 | HR 82 | Temp 97.6°F | Resp 18

## 2022-11-07 DIAGNOSIS — D509 Iron deficiency anemia, unspecified: Secondary | ICD-10-CM | POA: Diagnosis not present

## 2022-11-07 DIAGNOSIS — D500 Iron deficiency anemia secondary to blood loss (chronic): Secondary | ICD-10-CM

## 2022-11-07 MED ORDER — ACETAMINOPHEN 325 MG PO TABS
650.0000 mg | ORAL_TABLET | Freq: Once | ORAL | Status: AC
  Administered 2022-11-07: 650 mg via ORAL
  Filled 2022-11-07: qty 2

## 2022-11-07 MED ORDER — CETIRIZINE HCL 10 MG PO TABS
10.0000 mg | ORAL_TABLET | Freq: Once | ORAL | Status: AC
  Administered 2022-11-07: 10 mg via ORAL
  Filled 2022-11-07: qty 1

## 2022-11-07 MED ORDER — SODIUM CHLORIDE 0.9 % IV SOLN
510.0000 mg | Freq: Once | INTRAVENOUS | Status: AC
  Administered 2022-11-07: 510 mg via INTRAVENOUS
  Filled 2022-11-07: qty 510

## 2022-11-07 MED ORDER — SODIUM CHLORIDE 0.9 % IV SOLN: Freq: Once | INTRAVENOUS | Status: AC

## 2022-11-07 NOTE — Patient Instructions (Signed)
[facility]  Discharge Instructions: Thank you for choosing [facility] to provide your oncology and hematology care.  If you have a lab appointment with the Cancer Center - please note that after April 8th, 2024, all labs will be drawn in the cancer center.  You do not have to check in or register with the main entrance as you have in the past but will complete your check-in in the cancer center.  Wear comfortable clothing and clothing appropriate for easy access to any Portacath or PICC line.   We strive to give you quality time with your provider. You may need to reschedule your appointment if you arrive late (15 or more minutes).  Arriving late affects you and other patients whose appointments are after yours.  Also, if you miss three or more appointments without notifying the office, you may be dismissed from the clinic at the provider's discretion.      For prescription refill requests, have your pharmacy contact our office and allow 72 hours for refills to be completed.    Today you received your iron infusion    To help prevent nausea and vomiting after your treatment, we encourage you to take your nausea medication as directed.  BELOW ARE SYMPTOMS THAT SHOULD BE REPORTED IMMEDIATELY: *FEVER GREATER THAN 100.4 F (38 C) OR HIGHER *CHILLS OR SWEATING *NAUSEA AND VOMITING THAT IS NOT CONTROLLED WITH YOUR NAUSEA MEDICATION *UNUSUAL SHORTNESS OF BREATH *UNUSUAL BRUISING OR BLEEDING *URINARY PROBLEMS (pain or burning when urinating, or frequent urination) *BOWEL PROBLEMS (unusual diarrhea, constipation, pain near the anus) TENDERNESS IN MOUTH AND THROAT WITH OR WITHOUT PRESENCE OF ULCERS (sore throat, sores in mouth, or a toothache) UNUSUAL RASH, SWELLING OR PAIN  UNUSUAL VAGINAL DISCHARGE OR ITCHING   Items with * indicate a potential emergency and should be followed up as soon as possible or go to the Emergency Department if any problems should  occur.  Please show the CHEMOTHERAPY ALERT CARD or IMMUNOTHERAPY ALERT CARD at check-in to the Emergency Department and triage nurse.  Should you have questions after your visit or need to cancel or reschedule your appointment, please contact [facility] [phone]  and follow the prompts.  Office hours are 8:00 a.m. to 4:30 p.m. Monday - Friday. Please note that voicemails left after 4:00 p.m. may not be returned until the following business day.  We are closed weekends and major holidays. You have access to a nurse at all times for urgent questions. Please call the main number to the clinic [phone] and follow the prompts.  For any non-urgent questions, you may also contact your provider using MyChart. We now offer e-Visits for anyone 50 and older to request care online for non-urgent symptoms. For details visit [email or web address].   Also download the MyChart app! Go to the app store, search "MyChart", open the app, select [place], and log in with your MyChart username and password.

## 2022-11-07 NOTE — Patient Outreach (Signed)
Ms. Wilmott resides in [facility] skilled nursing facility. Screening for potential [facility] care coordination services as benefit of health plan and Primary Care Provider.   Secure communication sent to [employer], Child psychotherapist to inquire about transition plans/needs.  Will continue to follow.   Raiford Noble, MSN, RN,BSN [facility] Post Acute Care Coordinator [phone] (Direct dial)

## 2022-11-07 NOTE — Progress Notes (Signed)
Iron infusion given per orders. Patient tolerated it well without problems. Vitals stable and discharged home from clinic via wheelchair. Follow up as scheduled.  

## 2022-11-14 ENCOUNTER — Inpatient Hospital Stay: Payer: 59

## 2022-11-14 ENCOUNTER — Inpatient Hospital Stay: Payer: 59 | Admitting: Oncology

## 2022-11-14 VITALS — BP 108/77 | HR 71 | Temp 97.2°F | Resp 18

## 2022-11-14 DIAGNOSIS — D500 Iron deficiency anemia secondary to blood loss (chronic): Secondary | ICD-10-CM

## 2022-11-14 DIAGNOSIS — D509 Iron deficiency anemia, unspecified: Secondary | ICD-10-CM | POA: Diagnosis not present

## 2022-11-14 MED ORDER — CETIRIZINE HCL 10 MG PO TABS
10.0000 mg | ORAL_TABLET | Freq: Once | ORAL | Status: AC
  Administered 2022-11-14: 10 mg via ORAL
  Filled 2022-11-14: qty 1

## 2022-11-14 MED ORDER — ACETAMINOPHEN 325 MG PO TABS
650.0000 mg | ORAL_TABLET | Freq: Once | ORAL | Status: AC
  Administered 2022-11-14: 650 mg via ORAL
  Filled 2022-11-14: qty 2

## 2022-11-14 MED ORDER — SODIUM CHLORIDE 0.9 % IV SOLN
510.0000 mg | Freq: Once | INTRAVENOUS | Status: AC
  Administered 2022-11-14: 510 mg via INTRAVENOUS
  Filled 2022-11-14: qty 510

## 2022-11-14 MED ORDER — SODIUM CHLORIDE 0.9 % IV SOLN: Freq: Once | INTRAVENOUS | Status: AC

## 2022-11-14 NOTE — Progress Notes (Signed)
Patient tolerated iron infusion with no complaints voiced.  Peripheral IV site clean and dry with good blood return noted before and after infusion.  Band aid applied.  VSS with discharge and left in satisfactory condition with no s/s of distress noted.   

## 2022-11-14 NOTE — Patient Instructions (Signed)
[facility] [place]  Discharge Instructions: Thank you for choosing [facility] to provide your oncology and hematology care.  If you have a lab appointment with the Cancer Center - please note that after April 8th, 2024, all labs will be drawn in the cancer center.  You do not have to check in or register with the main entrance as you have in the past but will complete your check-in in the cancer center.  Wear comfortable clothing and clothing appropriate for easy access to any Portacath or PICC line.   We strive to give you quality time with your provider. You may need to reschedule your appointment if you arrive late (15 or more minutes).  Arriving late affects you and other patients whose appointments are after yours.  Also, if you miss three or more appointments without notifying the office, you may be dismissed from the clinic at the provider's discretion.      For prescription refill requests, have your pharmacy contact our office and allow 72 hours for refills to be completed.    Today you received the following Feraheme, return as scheduled.   To help prevent nausea and vomiting after your treatment, we encourage you to take your nausea medication as directed.  BELOW ARE SYMPTOMS THAT SHOULD BE REPORTED IMMEDIATELY: *FEVER GREATER THAN 100.4 F (38 C) OR HIGHER *CHILLS OR SWEATING *NAUSEA AND VOMITING THAT IS NOT CONTROLLED WITH YOUR NAUSEA MEDICATION *UNUSUAL SHORTNESS OF BREATH *UNUSUAL BRUISING OR BLEEDING *URINARY PROBLEMS (pain or burning when urinating, or frequent urination) *BOWEL PROBLEMS (unusual diarrhea, constipation, pain near the anus) TENDERNESS IN MOUTH AND THROAT WITH OR WITHOUT PRESENCE OF ULCERS (sore throat, sores in mouth, or a toothache) UNUSUAL RASH, SWELLING OR PAIN  UNUSUAL VAGINAL DISCHARGE OR ITCHING   Items with * indicate a potential emergency and should be followed up as soon as possible or go to the Emergency Department if  any problems should occur.  Please show the CHEMOTHERAPY ALERT CARD or IMMUNOTHERAPY ALERT CARD at check-in to the Emergency Department and triage nurse.  Should you have questions after your visit or need to cancel or reschedule your appointment, please contact [facility] [place] [phone]  and follow the prompts.  Office hours are 8:00 a.m. to 4:30 p.m. Monday - Friday. Please note that voicemails left after 4:00 p.m. may not be returned until the following business day.  We are closed weekends and major holidays. You have access to a nurse at all times for urgent questions. Please call the main number to the clinic [phone] and follow the prompts.  For any non-urgent questions, you may also contact your provider using MyChart. We now offer e-Visits for anyone 18 and older to request care online for non-urgent symptoms. For details visit [email or web address].   Also download the MyChart app! Go to the app store, search "MyChart", open the app, select [place], and log in with your MyChart username and password.   

## 2022-11-15 DIAGNOSIS — Z7982 Long term (current) use of aspirin: Secondary | ICD-10-CM | POA: Diagnosis not present

## 2022-11-15 DIAGNOSIS — D649 Anemia, unspecified: Secondary | ICD-10-CM | POA: Diagnosis not present

## 2022-11-15 DIAGNOSIS — E785 Hyperlipidemia, unspecified: Secondary | ICD-10-CM | POA: Diagnosis not present

## 2022-11-15 DIAGNOSIS — Z556 Problems related to health literacy: Secondary | ICD-10-CM | POA: Diagnosis not present

## 2022-11-15 DIAGNOSIS — G4733 Obstructive sleep apnea (adult) (pediatric): Secondary | ICD-10-CM | POA: Diagnosis not present

## 2022-11-15 DIAGNOSIS — I69351 Hemiplegia and hemiparesis following cerebral infarction affecting right dominant side: Secondary | ICD-10-CM | POA: Diagnosis not present

## 2022-11-15 DIAGNOSIS — M62521 Muscle wasting and atrophy, not elsewhere classified, right upper arm: Secondary | ICD-10-CM | POA: Diagnosis not present

## 2022-11-15 DIAGNOSIS — K5900 Constipation, unspecified: Secondary | ICD-10-CM | POA: Diagnosis not present

## 2022-11-15 DIAGNOSIS — J449 Chronic obstructive pulmonary disease, unspecified: Secondary | ICD-10-CM | POA: Diagnosis not present

## 2022-11-15 DIAGNOSIS — Z955 Presence of coronary angioplasty implant and graft: Secondary | ICD-10-CM | POA: Diagnosis not present

## 2022-11-15 DIAGNOSIS — M6281 Muscle weakness (generalized): Secondary | ICD-10-CM | POA: Diagnosis not present

## 2022-11-15 DIAGNOSIS — K219 Gastro-esophageal reflux disease without esophagitis: Secondary | ICD-10-CM | POA: Diagnosis not present

## 2022-11-15 DIAGNOSIS — F02A18 Dementia in other diseases classified elsewhere, mild, with other behavioral disturbance: Secondary | ICD-10-CM | POA: Diagnosis not present

## 2022-11-15 DIAGNOSIS — R2689 Other abnormalities of gait and mobility: Secondary | ICD-10-CM | POA: Diagnosis not present

## 2022-11-15 DIAGNOSIS — R262 Difficulty in walking, not elsewhere classified: Secondary | ICD-10-CM | POA: Diagnosis not present

## 2022-11-15 DIAGNOSIS — Z602 Problems related to living alone: Secondary | ICD-10-CM | POA: Diagnosis not present

## 2022-11-15 DIAGNOSIS — Z9181 History of falling: Secondary | ICD-10-CM | POA: Diagnosis not present

## 2022-11-15 DIAGNOSIS — I69315 Cognitive social or emotional deficit following cerebral infarction: Secondary | ICD-10-CM | POA: Diagnosis not present

## 2022-11-15 DIAGNOSIS — I10 Essential (primary) hypertension: Secondary | ICD-10-CM | POA: Diagnosis not present

## 2022-11-15 DIAGNOSIS — I25119 Atherosclerotic heart disease of native coronary artery with unspecified angina pectoris: Secondary | ICD-10-CM | POA: Diagnosis not present

## 2022-11-15 DIAGNOSIS — F02A3 Dementia in other diseases classified elsewhere, mild, with mood disturbance: Secondary | ICD-10-CM | POA: Diagnosis not present

## 2022-11-15 DIAGNOSIS — M62522 Muscle wasting and atrophy, not elsewhere classified, left upper arm: Secondary | ICD-10-CM | POA: Diagnosis not present

## 2022-11-15 DIAGNOSIS — G309 Alzheimer's disease, unspecified: Secondary | ICD-10-CM | POA: Diagnosis not present

## 2022-11-17 DIAGNOSIS — M62521 Muscle wasting and atrophy, not elsewhere classified, right upper arm: Secondary | ICD-10-CM | POA: Diagnosis not present

## 2022-11-17 DIAGNOSIS — G4733 Obstructive sleep apnea (adult) (pediatric): Secondary | ICD-10-CM | POA: Diagnosis not present

## 2022-11-17 DIAGNOSIS — I10 Essential (primary) hypertension: Secondary | ICD-10-CM | POA: Diagnosis not present

## 2022-11-17 DIAGNOSIS — F02A18 Dementia in other diseases classified elsewhere, mild, with other behavioral disturbance: Secondary | ICD-10-CM | POA: Diagnosis not present

## 2022-11-17 DIAGNOSIS — I69315 Cognitive social or emotional deficit following cerebral infarction: Secondary | ICD-10-CM | POA: Diagnosis not present

## 2022-11-17 DIAGNOSIS — K219 Gastro-esophageal reflux disease without esophagitis: Secondary | ICD-10-CM | POA: Diagnosis not present

## 2022-11-17 DIAGNOSIS — E785 Hyperlipidemia, unspecified: Secondary | ICD-10-CM | POA: Diagnosis not present

## 2022-11-17 DIAGNOSIS — I25119 Atherosclerotic heart disease of native coronary artery with unspecified angina pectoris: Secondary | ICD-10-CM | POA: Diagnosis not present

## 2022-11-17 DIAGNOSIS — Z7982 Long term (current) use of aspirin: Secondary | ICD-10-CM | POA: Diagnosis not present

## 2022-11-17 DIAGNOSIS — I69351 Hemiplegia and hemiparesis following cerebral infarction affecting right dominant side: Secondary | ICD-10-CM | POA: Diagnosis not present

## 2022-11-17 DIAGNOSIS — F02A3 Dementia in other diseases classified elsewhere, mild, with mood disturbance: Secondary | ICD-10-CM | POA: Diagnosis not present

## 2022-11-17 DIAGNOSIS — R2689 Other abnormalities of gait and mobility: Secondary | ICD-10-CM | POA: Diagnosis not present

## 2022-11-17 DIAGNOSIS — Z602 Problems related to living alone: Secondary | ICD-10-CM | POA: Diagnosis not present

## 2022-11-17 DIAGNOSIS — R262 Difficulty in walking, not elsewhere classified: Secondary | ICD-10-CM | POA: Diagnosis not present

## 2022-11-17 DIAGNOSIS — Z955 Presence of coronary angioplasty implant and graft: Secondary | ICD-10-CM | POA: Diagnosis not present

## 2022-11-17 DIAGNOSIS — D649 Anemia, unspecified: Secondary | ICD-10-CM | POA: Diagnosis not present

## 2022-11-17 DIAGNOSIS — K5900 Constipation, unspecified: Secondary | ICD-10-CM | POA: Diagnosis not present

## 2022-11-17 DIAGNOSIS — M62522 Muscle wasting and atrophy, not elsewhere classified, left upper arm: Secondary | ICD-10-CM | POA: Diagnosis not present

## 2022-11-17 DIAGNOSIS — J449 Chronic obstructive pulmonary disease, unspecified: Secondary | ICD-10-CM | POA: Diagnosis not present

## 2022-11-17 DIAGNOSIS — Z556 Problems related to health literacy: Secondary | ICD-10-CM | POA: Diagnosis not present

## 2022-11-17 DIAGNOSIS — G309 Alzheimer's disease, unspecified: Secondary | ICD-10-CM | POA: Diagnosis not present

## 2022-11-17 DIAGNOSIS — M6281 Muscle weakness (generalized): Secondary | ICD-10-CM | POA: Diagnosis not present

## 2022-11-17 DIAGNOSIS — Z9181 History of falling: Secondary | ICD-10-CM | POA: Diagnosis not present

## 2022-11-18 ENCOUNTER — Other Ambulatory Visit: Payer: Self-pay | Admitting: *Deleted

## 2022-11-18 DIAGNOSIS — F02A3 Dementia in other diseases classified elsewhere, mild, with mood disturbance: Secondary | ICD-10-CM | POA: Diagnosis not present

## 2022-11-18 DIAGNOSIS — K219 Gastro-esophageal reflux disease without esophagitis: Secondary | ICD-10-CM | POA: Diagnosis not present

## 2022-11-18 DIAGNOSIS — M6281 Muscle weakness (generalized): Secondary | ICD-10-CM | POA: Diagnosis not present

## 2022-11-18 DIAGNOSIS — Z955 Presence of coronary angioplasty implant and graft: Secondary | ICD-10-CM | POA: Diagnosis not present

## 2022-11-18 DIAGNOSIS — M62522 Muscle wasting and atrophy, not elsewhere classified, left upper arm: Secondary | ICD-10-CM | POA: Diagnosis not present

## 2022-11-18 DIAGNOSIS — Z602 Problems related to living alone: Secondary | ICD-10-CM | POA: Diagnosis not present

## 2022-11-18 DIAGNOSIS — J449 Chronic obstructive pulmonary disease, unspecified: Secondary | ICD-10-CM | POA: Diagnosis not present

## 2022-11-18 DIAGNOSIS — Z9181 History of falling: Secondary | ICD-10-CM | POA: Diagnosis not present

## 2022-11-18 DIAGNOSIS — I69351 Hemiplegia and hemiparesis following cerebral infarction affecting right dominant side: Secondary | ICD-10-CM | POA: Diagnosis not present

## 2022-11-18 DIAGNOSIS — K5900 Constipation, unspecified: Secondary | ICD-10-CM | POA: Diagnosis not present

## 2022-11-18 DIAGNOSIS — D649 Anemia, unspecified: Secondary | ICD-10-CM | POA: Diagnosis not present

## 2022-11-18 DIAGNOSIS — G4733 Obstructive sleep apnea (adult) (pediatric): Secondary | ICD-10-CM | POA: Diagnosis not present

## 2022-11-18 DIAGNOSIS — M62521 Muscle wasting and atrophy, not elsewhere classified, right upper arm: Secondary | ICD-10-CM | POA: Diagnosis not present

## 2022-11-18 DIAGNOSIS — E785 Hyperlipidemia, unspecified: Secondary | ICD-10-CM | POA: Diagnosis not present

## 2022-11-18 DIAGNOSIS — R2689 Other abnormalities of gait and mobility: Secondary | ICD-10-CM | POA: Diagnosis not present

## 2022-11-18 DIAGNOSIS — G309 Alzheimer's disease, unspecified: Secondary | ICD-10-CM | POA: Diagnosis not present

## 2022-11-18 DIAGNOSIS — R262 Difficulty in walking, not elsewhere classified: Secondary | ICD-10-CM | POA: Diagnosis not present

## 2022-11-18 DIAGNOSIS — F02A18 Dementia in other diseases classified elsewhere, mild, with other behavioral disturbance: Secondary | ICD-10-CM | POA: Diagnosis not present

## 2022-11-18 DIAGNOSIS — Z556 Problems related to health literacy: Secondary | ICD-10-CM | POA: Diagnosis not present

## 2022-11-18 DIAGNOSIS — I10 Essential (primary) hypertension: Secondary | ICD-10-CM | POA: Diagnosis not present

## 2022-11-18 DIAGNOSIS — Z7982 Long term (current) use of aspirin: Secondary | ICD-10-CM | POA: Diagnosis not present

## 2022-11-18 DIAGNOSIS — I25119 Atherosclerotic heart disease of native coronary artery with unspecified angina pectoris: Secondary | ICD-10-CM | POA: Diagnosis not present

## 2022-11-18 DIAGNOSIS — I69315 Cognitive social or emotional deficit following cerebral infarction: Secondary | ICD-10-CM | POA: Diagnosis not present

## 2022-11-18 NOTE — Patient Outreach (Signed)
Per [facility] Tara Love discharged from [facility] skilled nursing facility on 11/14/22. [facility] arranged. Screening for potential [facility] care coordination services as benefit of health plan and  Primary Care Provider.  Telephone call made to Tara Love [phone]. Patient identifiers confirmed. Tara Love asks writer to contact daughter Tara Love at [phone] to discuss care coordination services.   Telephone call made to Tara Love (daughter) [phone]. Patient identifiers confirmed. Tara Love states she

## 2022-11-21 DIAGNOSIS — N183 Chronic kidney disease, stage 3 unspecified: Secondary | ICD-10-CM | POA: Diagnosis not present

## 2022-11-21 DIAGNOSIS — E782 Mixed hyperlipidemia: Secondary | ICD-10-CM | POA: Diagnosis not present

## 2022-11-21 DIAGNOSIS — D529 Folate deficiency anemia, unspecified: Secondary | ICD-10-CM | POA: Diagnosis not present

## 2022-11-21 DIAGNOSIS — D511 Vitamin B12 deficiency anemia due to selective vitamin B12 malabsorption with proteinuria: Secondary | ICD-10-CM | POA: Diagnosis not present

## 2022-11-21 DIAGNOSIS — D649 Anemia, unspecified: Secondary | ICD-10-CM | POA: Diagnosis not present

## 2022-11-21 DIAGNOSIS — E559 Vitamin D deficiency, unspecified: Secondary | ICD-10-CM | POA: Diagnosis not present

## 2022-11-21 DIAGNOSIS — I10 Essential (primary) hypertension: Secondary | ICD-10-CM | POA: Diagnosis not present

## 2022-11-21 DIAGNOSIS — E7849 Other hyperlipidemia: Secondary | ICD-10-CM | POA: Diagnosis not present

## 2022-11-24 DIAGNOSIS — D649 Anemia, unspecified: Secondary | ICD-10-CM | POA: Diagnosis not present

## 2022-11-24 DIAGNOSIS — I10 Essential (primary) hypertension: Secondary | ICD-10-CM | POA: Diagnosis not present

## 2022-11-24 DIAGNOSIS — F02A3 Dementia in other diseases classified elsewhere, mild, with mood disturbance: Secondary | ICD-10-CM | POA: Diagnosis not present

## 2022-11-24 DIAGNOSIS — Z7982 Long term (current) use of aspirin: Secondary | ICD-10-CM | POA: Diagnosis not present

## 2022-11-24 DIAGNOSIS — Z955 Presence of coronary angioplasty implant and graft: Secondary | ICD-10-CM | POA: Diagnosis not present

## 2022-11-24 DIAGNOSIS — M62521 Muscle wasting and atrophy, not elsewhere classified, right upper arm: Secondary | ICD-10-CM | POA: Diagnosis not present

## 2022-11-24 DIAGNOSIS — M6281 Muscle weakness (generalized): Secondary | ICD-10-CM | POA: Diagnosis not present

## 2022-11-24 DIAGNOSIS — J449 Chronic obstructive pulmonary disease, unspecified: Secondary | ICD-10-CM | POA: Diagnosis not present

## 2022-11-24 DIAGNOSIS — I69315 Cognitive social or emotional deficit following cerebral infarction: Secondary | ICD-10-CM | POA: Diagnosis not present

## 2022-11-24 DIAGNOSIS — Z556 Problems related to health literacy: Secondary | ICD-10-CM | POA: Diagnosis not present

## 2022-11-24 DIAGNOSIS — G4733 Obstructive sleep apnea (adult) (pediatric): Secondary | ICD-10-CM | POA: Diagnosis not present

## 2022-11-24 DIAGNOSIS — Z9181 History of falling: Secondary | ICD-10-CM | POA: Diagnosis not present

## 2022-11-24 DIAGNOSIS — G309 Alzheimer's disease, unspecified: Secondary | ICD-10-CM | POA: Diagnosis not present

## 2022-11-24 DIAGNOSIS — I25119 Atherosclerotic heart disease of native coronary artery with unspecified angina pectoris: Secondary | ICD-10-CM | POA: Diagnosis not present

## 2022-11-24 DIAGNOSIS — E785 Hyperlipidemia, unspecified: Secondary | ICD-10-CM | POA: Diagnosis not present

## 2022-11-24 DIAGNOSIS — Z602 Problems related to living alone: Secondary | ICD-10-CM | POA: Diagnosis not present

## 2022-11-24 DIAGNOSIS — I69351 Hemiplegia and hemiparesis following cerebral infarction affecting right dominant side: Secondary | ICD-10-CM | POA: Diagnosis not present

## 2022-11-24 DIAGNOSIS — R2689 Other abnormalities of gait and mobility: Secondary | ICD-10-CM | POA: Diagnosis not present

## 2022-11-24 DIAGNOSIS — M62522 Muscle wasting and atrophy, not elsewhere classified, left upper arm: Secondary | ICD-10-CM | POA: Diagnosis not present

## 2022-11-24 DIAGNOSIS — K219 Gastro-esophageal reflux disease without esophagitis: Secondary | ICD-10-CM | POA: Diagnosis not present

## 2022-11-24 DIAGNOSIS — F02A18 Dementia in other diseases classified elsewhere, mild, with other behavioral disturbance: Secondary | ICD-10-CM | POA: Diagnosis not present

## 2022-11-24 DIAGNOSIS — K5900 Constipation, unspecified: Secondary | ICD-10-CM | POA: Diagnosis not present

## 2022-11-24 DIAGNOSIS — R262 Difficulty in walking, not elsewhere classified: Secondary | ICD-10-CM | POA: Diagnosis not present

## 2022-11-27 DIAGNOSIS — Z7982 Long term (current) use of aspirin: Secondary | ICD-10-CM | POA: Diagnosis not present

## 2022-11-27 DIAGNOSIS — M62522 Muscle wasting and atrophy, not elsewhere classified, left upper arm: Secondary | ICD-10-CM | POA: Diagnosis not present

## 2022-11-27 DIAGNOSIS — F02A3 Dementia in other diseases classified elsewhere, mild, with mood disturbance: Secondary | ICD-10-CM | POA: Diagnosis not present

## 2022-11-27 DIAGNOSIS — I69315 Cognitive social or emotional deficit following cerebral infarction: Secondary | ICD-10-CM | POA: Diagnosis not present

## 2022-11-27 DIAGNOSIS — Z9181 History of falling: Secondary | ICD-10-CM | POA: Diagnosis not present

## 2022-11-27 DIAGNOSIS — I69351 Hemiplegia and hemiparesis following cerebral infarction affecting right dominant side: Secondary | ICD-10-CM | POA: Diagnosis not present

## 2022-11-27 DIAGNOSIS — R2689 Other abnormalities of gait and mobility: Secondary | ICD-10-CM | POA: Diagnosis not present

## 2022-11-27 DIAGNOSIS — D649 Anemia, unspecified: Secondary | ICD-10-CM | POA: Diagnosis not present

## 2022-11-27 DIAGNOSIS — K5900 Constipation, unspecified: Secondary | ICD-10-CM | POA: Diagnosis not present

## 2022-11-27 DIAGNOSIS — I25119 Atherosclerotic heart disease of native coronary artery with unspecified angina pectoris: Secondary | ICD-10-CM | POA: Diagnosis not present

## 2022-11-27 DIAGNOSIS — R262 Difficulty in walking, not elsewhere classified: Secondary | ICD-10-CM | POA: Diagnosis not present

## 2022-11-27 DIAGNOSIS — M62521 Muscle wasting and atrophy, not elsewhere classified, right upper arm: Secondary | ICD-10-CM | POA: Diagnosis not present

## 2022-11-27 DIAGNOSIS — I10 Essential (primary) hypertension: Secondary | ICD-10-CM | POA: Diagnosis not present

## 2022-11-27 DIAGNOSIS — M6281 Muscle weakness (generalized): Secondary | ICD-10-CM | POA: Diagnosis not present

## 2022-11-27 DIAGNOSIS — G4733 Obstructive sleep apnea (adult) (pediatric): Secondary | ICD-10-CM | POA: Diagnosis not present

## 2022-11-27 DIAGNOSIS — Z556 Problems related to health literacy: Secondary | ICD-10-CM | POA: Diagnosis not present

## 2022-11-27 DIAGNOSIS — Z955 Presence of coronary angioplasty implant and graft: Secondary | ICD-10-CM | POA: Diagnosis not present

## 2022-11-27 DIAGNOSIS — G309 Alzheimer's disease, unspecified: Secondary | ICD-10-CM | POA: Diagnosis not present

## 2022-11-27 DIAGNOSIS — F02A18 Dementia in other diseases classified elsewhere, mild, with other behavioral disturbance: Secondary | ICD-10-CM | POA: Diagnosis not present

## 2022-11-27 DIAGNOSIS — K219 Gastro-esophageal reflux disease without esophagitis: Secondary | ICD-10-CM | POA: Diagnosis not present

## 2022-11-27 DIAGNOSIS — E785 Hyperlipidemia, unspecified: Secondary | ICD-10-CM | POA: Diagnosis not present

## 2022-11-27 DIAGNOSIS — J449 Chronic obstructive pulmonary disease, unspecified: Secondary | ICD-10-CM | POA: Diagnosis not present

## 2022-11-27 DIAGNOSIS — Z602 Problems related to living alone: Secondary | ICD-10-CM | POA: Diagnosis not present

## 2022-11-28 DIAGNOSIS — Z602 Problems related to living alone: Secondary | ICD-10-CM | POA: Diagnosis not present

## 2022-11-28 DIAGNOSIS — F02A18 Dementia in other diseases classified elsewhere, mild, with other behavioral disturbance: Secondary | ICD-10-CM | POA: Diagnosis not present

## 2022-11-28 DIAGNOSIS — K219 Gastro-esophageal reflux disease without esophagitis: Secondary | ICD-10-CM | POA: Diagnosis not present

## 2022-11-28 DIAGNOSIS — I69315 Cognitive social or emotional deficit following cerebral infarction: Secondary | ICD-10-CM | POA: Diagnosis not present

## 2022-11-28 DIAGNOSIS — M6281 Muscle weakness (generalized): Secondary | ICD-10-CM | POA: Diagnosis not present

## 2022-11-28 DIAGNOSIS — Z7982 Long term (current) use of aspirin: Secondary | ICD-10-CM | POA: Diagnosis not present

## 2022-11-28 DIAGNOSIS — Z9181 History of falling: Secondary | ICD-10-CM | POA: Diagnosis not present

## 2022-11-28 DIAGNOSIS — I10 Essential (primary) hypertension: Secondary | ICD-10-CM | POA: Diagnosis not present

## 2022-11-28 DIAGNOSIS — M62521 Muscle wasting and atrophy, not elsewhere classified, right upper arm: Secondary | ICD-10-CM | POA: Diagnosis not present

## 2022-11-28 DIAGNOSIS — M62522 Muscle wasting and atrophy, not elsewhere classified, left upper arm: Secondary | ICD-10-CM | POA: Diagnosis not present

## 2022-11-28 DIAGNOSIS — I69351 Hemiplegia and hemiparesis following cerebral infarction affecting right dominant side: Secondary | ICD-10-CM | POA: Diagnosis not present

## 2022-11-28 DIAGNOSIS — G4733 Obstructive sleep apnea (adult) (pediatric): Secondary | ICD-10-CM | POA: Diagnosis not present

## 2022-11-28 DIAGNOSIS — Z556 Problems related to health literacy: Secondary | ICD-10-CM | POA: Diagnosis not present

## 2022-11-28 DIAGNOSIS — R2689 Other abnormalities of gait and mobility: Secondary | ICD-10-CM | POA: Diagnosis not present

## 2022-11-28 DIAGNOSIS — G309 Alzheimer's disease, unspecified: Secondary | ICD-10-CM | POA: Diagnosis not present

## 2022-11-28 DIAGNOSIS — D649 Anemia, unspecified: Secondary | ICD-10-CM | POA: Diagnosis not present

## 2022-11-28 DIAGNOSIS — F02A3 Dementia in other diseases classified elsewhere, mild, with mood disturbance: Secondary | ICD-10-CM | POA: Diagnosis not present

## 2022-11-28 DIAGNOSIS — I25119 Atherosclerotic heart disease of native coronary artery with unspecified angina pectoris: Secondary | ICD-10-CM | POA: Diagnosis not present

## 2022-11-28 DIAGNOSIS — Z955 Presence of coronary angioplasty implant and graft: Secondary | ICD-10-CM | POA: Diagnosis not present

## 2022-11-28 DIAGNOSIS — J449 Chronic obstructive pulmonary disease, unspecified: Secondary | ICD-10-CM | POA: Diagnosis not present

## 2022-11-28 DIAGNOSIS — K5900 Constipation, unspecified: Secondary | ICD-10-CM | POA: Diagnosis not present

## 2022-11-28 DIAGNOSIS — R262 Difficulty in walking, not elsewhere classified: Secondary | ICD-10-CM | POA: Diagnosis not present

## 2022-11-28 DIAGNOSIS — E785 Hyperlipidemia, unspecified: Secondary | ICD-10-CM | POA: Diagnosis not present

## 2022-12-01 DIAGNOSIS — G309 Alzheimer's disease, unspecified: Secondary | ICD-10-CM | POA: Diagnosis not present

## 2022-12-01 DIAGNOSIS — K5900 Constipation, unspecified: Secondary | ICD-10-CM | POA: Diagnosis not present

## 2022-12-01 DIAGNOSIS — I10 Essential (primary) hypertension: Secondary | ICD-10-CM | POA: Diagnosis not present

## 2022-12-01 DIAGNOSIS — R2689 Other abnormalities of gait and mobility: Secondary | ICD-10-CM | POA: Diagnosis not present

## 2022-12-01 DIAGNOSIS — I69315 Cognitive social or emotional deficit following cerebral infarction: Secondary | ICD-10-CM | POA: Diagnosis not present

## 2022-12-01 DIAGNOSIS — M62522 Muscle wasting and atrophy, not elsewhere classified, left upper arm: Secondary | ICD-10-CM | POA: Diagnosis not present

## 2022-12-01 DIAGNOSIS — I25119 Atherosclerotic heart disease of native coronary artery with unspecified angina pectoris: Secondary | ICD-10-CM | POA: Diagnosis not present

## 2022-12-01 DIAGNOSIS — F02A3 Dementia in other diseases classified elsewhere, mild, with mood disturbance: Secondary | ICD-10-CM | POA: Diagnosis not present

## 2022-12-01 DIAGNOSIS — G4733 Obstructive sleep apnea (adult) (pediatric): Secondary | ICD-10-CM | POA: Diagnosis not present

## 2022-12-01 DIAGNOSIS — J449 Chronic obstructive pulmonary disease, unspecified: Secondary | ICD-10-CM | POA: Diagnosis not present

## 2022-12-01 DIAGNOSIS — K219 Gastro-esophageal reflux disease without esophagitis: Secondary | ICD-10-CM | POA: Diagnosis not present

## 2022-12-01 DIAGNOSIS — Z556 Problems related to health literacy: Secondary | ICD-10-CM | POA: Diagnosis not present

## 2022-12-01 DIAGNOSIS — I69351 Hemiplegia and hemiparesis following cerebral infarction affecting right dominant side: Secondary | ICD-10-CM | POA: Diagnosis not present

## 2022-12-01 DIAGNOSIS — E785 Hyperlipidemia, unspecified: Secondary | ICD-10-CM | POA: Diagnosis not present

## 2022-12-01 DIAGNOSIS — D649 Anemia, unspecified: Secondary | ICD-10-CM | POA: Diagnosis not present

## 2022-12-01 DIAGNOSIS — Z602 Problems related to living alone: Secondary | ICD-10-CM | POA: Diagnosis not present

## 2022-12-01 DIAGNOSIS — Z7982 Long term (current) use of aspirin: Secondary | ICD-10-CM | POA: Diagnosis not present

## 2022-12-01 DIAGNOSIS — Z955 Presence of coronary angioplasty implant and graft: Secondary | ICD-10-CM | POA: Diagnosis not present

## 2022-12-01 DIAGNOSIS — R262 Difficulty in walking, not elsewhere classified: Secondary | ICD-10-CM | POA: Diagnosis not present

## 2022-12-01 DIAGNOSIS — M62521 Muscle wasting and atrophy, not elsewhere classified, right upper arm: Secondary | ICD-10-CM | POA: Diagnosis not present

## 2022-12-01 DIAGNOSIS — F02A18 Dementia in other diseases classified elsewhere, mild, with other behavioral disturbance: Secondary | ICD-10-CM | POA: Diagnosis not present

## 2022-12-01 DIAGNOSIS — Z9181 History of falling: Secondary | ICD-10-CM | POA: Diagnosis not present

## 2022-12-01 DIAGNOSIS — M6281 Muscle weakness (generalized): Secondary | ICD-10-CM | POA: Diagnosis not present

## 2022-12-02 DIAGNOSIS — D649 Anemia, unspecified: Secondary | ICD-10-CM | POA: Diagnosis not present

## 2022-12-02 DIAGNOSIS — E785 Hyperlipidemia, unspecified: Secondary | ICD-10-CM | POA: Diagnosis not present

## 2022-12-02 DIAGNOSIS — J449 Chronic obstructive pulmonary disease, unspecified: Secondary | ICD-10-CM | POA: Diagnosis not present

## 2022-12-02 DIAGNOSIS — I69315 Cognitive social or emotional deficit following cerebral infarction: Secondary | ICD-10-CM | POA: Diagnosis not present

## 2022-12-02 DIAGNOSIS — K5900 Constipation, unspecified: Secondary | ICD-10-CM | POA: Diagnosis not present

## 2022-12-02 DIAGNOSIS — Z556 Problems related to health literacy: Secondary | ICD-10-CM | POA: Diagnosis not present

## 2022-12-02 DIAGNOSIS — M6281 Muscle weakness (generalized): Secondary | ICD-10-CM | POA: Diagnosis not present

## 2022-12-02 DIAGNOSIS — R2689 Other abnormalities of gait and mobility: Secondary | ICD-10-CM | POA: Diagnosis not present

## 2022-12-02 DIAGNOSIS — M62522 Muscle wasting and atrophy, not elsewhere classified, left upper arm: Secondary | ICD-10-CM | POA: Diagnosis not present

## 2022-12-02 DIAGNOSIS — I10 Essential (primary) hypertension: Secondary | ICD-10-CM | POA: Diagnosis not present

## 2022-12-02 DIAGNOSIS — G309 Alzheimer's disease, unspecified: Secondary | ICD-10-CM | POA: Diagnosis not present

## 2022-12-02 DIAGNOSIS — I25119 Atherosclerotic heart disease of native coronary artery with unspecified angina pectoris: Secondary | ICD-10-CM | POA: Diagnosis not present

## 2022-12-02 DIAGNOSIS — M62521 Muscle wasting and atrophy, not elsewhere classified, right upper arm: Secondary | ICD-10-CM | POA: Diagnosis not present

## 2022-12-02 DIAGNOSIS — Z955 Presence of coronary angioplasty implant and graft: Secondary | ICD-10-CM | POA: Diagnosis not present

## 2022-12-02 DIAGNOSIS — F02A3 Dementia in other diseases classified elsewhere, mild, with mood disturbance: Secondary | ICD-10-CM | POA: Diagnosis not present

## 2022-12-02 DIAGNOSIS — F02A18 Dementia in other diseases classified elsewhere, mild, with other behavioral disturbance: Secondary | ICD-10-CM | POA: Diagnosis not present

## 2022-12-02 DIAGNOSIS — Z9181 History of falling: Secondary | ICD-10-CM | POA: Diagnosis not present

## 2022-12-02 DIAGNOSIS — G4733 Obstructive sleep apnea (adult) (pediatric): Secondary | ICD-10-CM | POA: Diagnosis not present

## 2022-12-02 DIAGNOSIS — R262 Difficulty in walking, not elsewhere classified: Secondary | ICD-10-CM | POA: Diagnosis not present

## 2022-12-02 DIAGNOSIS — Z7982 Long term (current) use of aspirin: Secondary | ICD-10-CM | POA: Diagnosis not present

## 2022-12-02 DIAGNOSIS — Z602 Problems related to living alone: Secondary | ICD-10-CM | POA: Diagnosis not present

## 2022-12-02 DIAGNOSIS — I69351 Hemiplegia and hemiparesis following cerebral infarction affecting right dominant side: Secondary | ICD-10-CM | POA: Diagnosis not present

## 2022-12-02 DIAGNOSIS — K219 Gastro-esophageal reflux disease without esophagitis: Secondary | ICD-10-CM | POA: Diagnosis not present

## 2022-12-04 DIAGNOSIS — G309 Alzheimer's disease, unspecified: Secondary | ICD-10-CM | POA: Diagnosis not present

## 2022-12-04 DIAGNOSIS — Z556 Problems related to health literacy: Secondary | ICD-10-CM | POA: Diagnosis not present

## 2022-12-04 DIAGNOSIS — G4733 Obstructive sleep apnea (adult) (pediatric): Secondary | ICD-10-CM | POA: Diagnosis not present

## 2022-12-04 DIAGNOSIS — E785 Hyperlipidemia, unspecified: Secondary | ICD-10-CM | POA: Diagnosis not present

## 2022-12-04 DIAGNOSIS — Z955 Presence of coronary angioplasty implant and graft: Secondary | ICD-10-CM | POA: Diagnosis not present

## 2022-12-04 DIAGNOSIS — I69351 Hemiplegia and hemiparesis following cerebral infarction affecting right dominant side: Secondary | ICD-10-CM | POA: Diagnosis not present

## 2022-12-04 DIAGNOSIS — Z7982 Long term (current) use of aspirin: Secondary | ICD-10-CM | POA: Diagnosis not present

## 2022-12-04 DIAGNOSIS — R262 Difficulty in walking, not elsewhere classified: Secondary | ICD-10-CM | POA: Diagnosis not present

## 2022-12-04 DIAGNOSIS — M6281 Muscle weakness (generalized): Secondary | ICD-10-CM | POA: Diagnosis not present

## 2022-12-04 DIAGNOSIS — M62522 Muscle wasting and atrophy, not elsewhere classified, left upper arm: Secondary | ICD-10-CM | POA: Diagnosis not present

## 2022-12-04 DIAGNOSIS — R2689 Other abnormalities of gait and mobility: Secondary | ICD-10-CM | POA: Diagnosis not present

## 2022-12-04 DIAGNOSIS — D649 Anemia, unspecified: Secondary | ICD-10-CM | POA: Diagnosis not present

## 2022-12-04 DIAGNOSIS — J449 Chronic obstructive pulmonary disease, unspecified: Secondary | ICD-10-CM | POA: Diagnosis not present

## 2022-12-04 DIAGNOSIS — I69315 Cognitive social or emotional deficit following cerebral infarction: Secondary | ICD-10-CM | POA: Diagnosis not present

## 2022-12-04 DIAGNOSIS — M62521 Muscle wasting and atrophy, not elsewhere classified, right upper arm: Secondary | ICD-10-CM | POA: Diagnosis not present

## 2022-12-04 DIAGNOSIS — K5900 Constipation, unspecified: Secondary | ICD-10-CM | POA: Diagnosis not present

## 2022-12-04 DIAGNOSIS — K219 Gastro-esophageal reflux disease without esophagitis: Secondary | ICD-10-CM | POA: Diagnosis not present

## 2022-12-04 DIAGNOSIS — F02A18 Dementia in other diseases classified elsewhere, mild, with other behavioral disturbance: Secondary | ICD-10-CM | POA: Diagnosis not present

## 2022-12-04 DIAGNOSIS — Z602 Problems related to living alone: Secondary | ICD-10-CM | POA: Diagnosis not present

## 2022-12-04 DIAGNOSIS — I10 Essential (primary) hypertension: Secondary | ICD-10-CM | POA: Diagnosis not present

## 2022-12-04 DIAGNOSIS — I25119 Atherosclerotic heart disease of native coronary artery with unspecified angina pectoris: Secondary | ICD-10-CM | POA: Diagnosis not present

## 2022-12-04 DIAGNOSIS — F02A3 Dementia in other diseases classified elsewhere, mild, with mood disturbance: Secondary | ICD-10-CM | POA: Diagnosis not present

## 2022-12-04 DIAGNOSIS — Z9181 History of falling: Secondary | ICD-10-CM | POA: Diagnosis not present

## 2022-12-09 DIAGNOSIS — E785 Hyperlipidemia, unspecified: Secondary | ICD-10-CM | POA: Diagnosis not present

## 2022-12-09 DIAGNOSIS — M62522 Muscle wasting and atrophy, not elsewhere classified, left upper arm: Secondary | ICD-10-CM | POA: Diagnosis not present

## 2022-12-09 DIAGNOSIS — I10 Essential (primary) hypertension: Secondary | ICD-10-CM | POA: Diagnosis not present

## 2022-12-09 DIAGNOSIS — R2689 Other abnormalities of gait and mobility: Secondary | ICD-10-CM | POA: Diagnosis not present

## 2022-12-09 DIAGNOSIS — Z556 Problems related to health literacy: Secondary | ICD-10-CM | POA: Diagnosis not present

## 2022-12-09 DIAGNOSIS — F02A18 Dementia in other diseases classified elsewhere, mild, with other behavioral disturbance: Secondary | ICD-10-CM | POA: Diagnosis not present

## 2022-12-09 DIAGNOSIS — M6281 Muscle weakness (generalized): Secondary | ICD-10-CM | POA: Diagnosis not present

## 2022-12-09 DIAGNOSIS — I69351 Hemiplegia and hemiparesis following cerebral infarction affecting right dominant side: Secondary | ICD-10-CM | POA: Diagnosis not present

## 2022-12-09 DIAGNOSIS — M62521 Muscle wasting and atrophy, not elsewhere classified, right upper arm: Secondary | ICD-10-CM | POA: Diagnosis not present

## 2022-12-09 DIAGNOSIS — K219 Gastro-esophageal reflux disease without esophagitis: Secondary | ICD-10-CM | POA: Diagnosis not present

## 2022-12-09 DIAGNOSIS — Z602 Problems related to living alone: Secondary | ICD-10-CM | POA: Diagnosis not present

## 2022-12-09 DIAGNOSIS — G309 Alzheimer's disease, unspecified: Secondary | ICD-10-CM | POA: Diagnosis not present

## 2022-12-09 DIAGNOSIS — J449 Chronic obstructive pulmonary disease, unspecified: Secondary | ICD-10-CM | POA: Diagnosis not present

## 2022-12-09 DIAGNOSIS — Z955 Presence of coronary angioplasty implant and graft: Secondary | ICD-10-CM | POA: Diagnosis not present

## 2022-12-09 DIAGNOSIS — G4733 Obstructive sleep apnea (adult) (pediatric): Secondary | ICD-10-CM | POA: Diagnosis not present

## 2022-12-09 DIAGNOSIS — Z9181 History of falling: Secondary | ICD-10-CM | POA: Diagnosis not present

## 2022-12-09 DIAGNOSIS — F02A3 Dementia in other diseases classified elsewhere, mild, with mood disturbance: Secondary | ICD-10-CM | POA: Diagnosis not present

## 2022-12-09 DIAGNOSIS — Z7982 Long term (current) use of aspirin: Secondary | ICD-10-CM | POA: Diagnosis not present

## 2022-12-09 DIAGNOSIS — I69315 Cognitive social or emotional deficit following cerebral infarction: Secondary | ICD-10-CM | POA: Diagnosis not present

## 2022-12-09 DIAGNOSIS — R262 Difficulty in walking, not elsewhere classified: Secondary | ICD-10-CM | POA: Diagnosis not present

## 2022-12-09 DIAGNOSIS — I25119 Atherosclerotic heart disease of native coronary artery with unspecified angina pectoris: Secondary | ICD-10-CM | POA: Diagnosis not present

## 2022-12-09 DIAGNOSIS — D649 Anemia, unspecified: Secondary | ICD-10-CM | POA: Diagnosis not present

## 2022-12-09 DIAGNOSIS — K5900 Constipation, unspecified: Secondary | ICD-10-CM | POA: Diagnosis not present

## 2022-12-10 DIAGNOSIS — Z955 Presence of coronary angioplasty implant and graft: Secondary | ICD-10-CM | POA: Diagnosis not present

## 2022-12-10 DIAGNOSIS — I25119 Atherosclerotic heart disease of native coronary artery with unspecified angina pectoris: Secondary | ICD-10-CM | POA: Diagnosis not present

## 2022-12-10 DIAGNOSIS — F02A18 Dementia in other diseases classified elsewhere, mild, with other behavioral disturbance: Secondary | ICD-10-CM | POA: Diagnosis not present

## 2022-12-10 DIAGNOSIS — I69351 Hemiplegia and hemiparesis following cerebral infarction affecting right dominant side: Secondary | ICD-10-CM | POA: Diagnosis not present

## 2022-12-10 DIAGNOSIS — M62521 Muscle wasting and atrophy, not elsewhere classified, right upper arm: Secondary | ICD-10-CM | POA: Diagnosis not present

## 2022-12-10 DIAGNOSIS — D649 Anemia, unspecified: Secondary | ICD-10-CM | POA: Diagnosis not present

## 2022-12-10 DIAGNOSIS — Z7982 Long term (current) use of aspirin: Secondary | ICD-10-CM | POA: Diagnosis not present

## 2022-12-10 DIAGNOSIS — K5900 Constipation, unspecified: Secondary | ICD-10-CM | POA: Diagnosis not present

## 2022-12-10 DIAGNOSIS — E785 Hyperlipidemia, unspecified: Secondary | ICD-10-CM | POA: Diagnosis not present

## 2022-12-10 DIAGNOSIS — G309 Alzheimer's disease, unspecified: Secondary | ICD-10-CM | POA: Diagnosis not present

## 2022-12-10 DIAGNOSIS — J449 Chronic obstructive pulmonary disease, unspecified: Secondary | ICD-10-CM | POA: Diagnosis not present

## 2022-12-10 DIAGNOSIS — Z9181 History of falling: Secondary | ICD-10-CM | POA: Diagnosis not present

## 2022-12-10 DIAGNOSIS — K219 Gastro-esophageal reflux disease without esophagitis: Secondary | ICD-10-CM | POA: Diagnosis not present

## 2022-12-10 DIAGNOSIS — I10 Essential (primary) hypertension: Secondary | ICD-10-CM | POA: Diagnosis not present

## 2022-12-10 DIAGNOSIS — I69315 Cognitive social or emotional deficit following cerebral infarction: Secondary | ICD-10-CM | POA: Diagnosis not present

## 2022-12-10 DIAGNOSIS — G4733 Obstructive sleep apnea (adult) (pediatric): Secondary | ICD-10-CM | POA: Diagnosis not present

## 2022-12-10 DIAGNOSIS — R262 Difficulty in walking, not elsewhere classified: Secondary | ICD-10-CM | POA: Diagnosis not present

## 2022-12-10 DIAGNOSIS — M6281 Muscle weakness (generalized): Secondary | ICD-10-CM | POA: Diagnosis not present

## 2022-12-10 DIAGNOSIS — M62522 Muscle wasting and atrophy, not elsewhere classified, left upper arm: Secondary | ICD-10-CM | POA: Diagnosis not present

## 2022-12-10 DIAGNOSIS — Z556 Problems related to health literacy: Secondary | ICD-10-CM | POA: Diagnosis not present

## 2022-12-10 DIAGNOSIS — F02A3 Dementia in other diseases classified elsewhere, mild, with mood disturbance: Secondary | ICD-10-CM | POA: Diagnosis not present

## 2022-12-10 DIAGNOSIS — Z602 Problems related to living alone: Secondary | ICD-10-CM | POA: Diagnosis not present

## 2022-12-10 DIAGNOSIS — R2689 Other abnormalities of gait and mobility: Secondary | ICD-10-CM | POA: Diagnosis not present

## 2022-12-12 DIAGNOSIS — Z602 Problems related to living alone: Secondary | ICD-10-CM | POA: Diagnosis not present

## 2022-12-12 DIAGNOSIS — Z556 Problems related to health literacy: Secondary | ICD-10-CM | POA: Diagnosis not present

## 2022-12-12 DIAGNOSIS — G4733 Obstructive sleep apnea (adult) (pediatric): Secondary | ICD-10-CM | POA: Diagnosis not present

## 2022-12-12 DIAGNOSIS — K219 Gastro-esophageal reflux disease without esophagitis: Secondary | ICD-10-CM | POA: Diagnosis not present

## 2022-12-12 DIAGNOSIS — G309 Alzheimer's disease, unspecified: Secondary | ICD-10-CM | POA: Diagnosis not present

## 2022-12-12 DIAGNOSIS — F02A18 Dementia in other diseases classified elsewhere, mild, with other behavioral disturbance: Secondary | ICD-10-CM | POA: Diagnosis not present

## 2022-12-12 DIAGNOSIS — F02A3 Dementia in other diseases classified elsewhere, mild, with mood disturbance: Secondary | ICD-10-CM | POA: Diagnosis not present

## 2022-12-12 DIAGNOSIS — Z9181 History of falling: Secondary | ICD-10-CM | POA: Diagnosis not present

## 2022-12-12 DIAGNOSIS — R262 Difficulty in walking, not elsewhere classified: Secondary | ICD-10-CM | POA: Diagnosis not present

## 2022-12-12 DIAGNOSIS — M62522 Muscle wasting and atrophy, not elsewhere classified, left upper arm: Secondary | ICD-10-CM | POA: Diagnosis not present

## 2022-12-12 DIAGNOSIS — J449 Chronic obstructive pulmonary disease, unspecified: Secondary | ICD-10-CM | POA: Diagnosis not present

## 2022-12-12 DIAGNOSIS — K5900 Constipation, unspecified: Secondary | ICD-10-CM | POA: Diagnosis not present

## 2022-12-12 DIAGNOSIS — I69315 Cognitive social or emotional deficit following cerebral infarction: Secondary | ICD-10-CM | POA: Diagnosis not present

## 2022-12-12 DIAGNOSIS — Z7982 Long term (current) use of aspirin: Secondary | ICD-10-CM | POA: Diagnosis not present

## 2022-12-12 DIAGNOSIS — Z955 Presence of coronary angioplasty implant and graft: Secondary | ICD-10-CM | POA: Diagnosis not present

## 2022-12-12 DIAGNOSIS — I10 Essential (primary) hypertension: Secondary | ICD-10-CM | POA: Diagnosis not present

## 2022-12-12 DIAGNOSIS — E785 Hyperlipidemia, unspecified: Secondary | ICD-10-CM | POA: Diagnosis not present

## 2022-12-12 DIAGNOSIS — D649 Anemia, unspecified: Secondary | ICD-10-CM | POA: Diagnosis not present

## 2022-12-12 DIAGNOSIS — I25119 Atherosclerotic heart disease of native coronary artery with unspecified angina pectoris: Secondary | ICD-10-CM | POA: Diagnosis not present

## 2022-12-12 DIAGNOSIS — I69351 Hemiplegia and hemiparesis following cerebral infarction affecting right dominant side: Secondary | ICD-10-CM | POA: Diagnosis not present

## 2022-12-12 DIAGNOSIS — R2689 Other abnormalities of gait and mobility: Secondary | ICD-10-CM | POA: Diagnosis not present

## 2022-12-12 DIAGNOSIS — M62521 Muscle wasting and atrophy, not elsewhere classified, right upper arm: Secondary | ICD-10-CM | POA: Diagnosis not present

## 2022-12-12 DIAGNOSIS — M6281 Muscle weakness (generalized): Secondary | ICD-10-CM | POA: Diagnosis not present

## 2022-12-14 DIAGNOSIS — N1831 Chronic kidney disease, stage 3a: Secondary | ICD-10-CM | POA: Diagnosis not present

## 2022-12-14 DIAGNOSIS — E7849 Other hyperlipidemia: Secondary | ICD-10-CM | POA: Diagnosis not present

## 2022-12-14 DIAGNOSIS — I25119 Atherosclerotic heart disease of native coronary artery with unspecified angina pectoris: Secondary | ICD-10-CM | POA: Diagnosis not present

## 2022-12-14 DIAGNOSIS — G309 Alzheimer's disease, unspecified: Secondary | ICD-10-CM | POA: Diagnosis not present

## 2022-12-14 DIAGNOSIS — D649 Anemia, unspecified: Secondary | ICD-10-CM | POA: Diagnosis not present

## 2022-12-14 DIAGNOSIS — I10 Essential (primary) hypertension: Secondary | ICD-10-CM | POA: Diagnosis not present

## 2022-12-16 DIAGNOSIS — R262 Difficulty in walking, not elsewhere classified: Secondary | ICD-10-CM | POA: Diagnosis not present

## 2022-12-16 DIAGNOSIS — R2689 Other abnormalities of gait and mobility: Secondary | ICD-10-CM | POA: Diagnosis not present

## 2022-12-16 DIAGNOSIS — I69351 Hemiplegia and hemiparesis following cerebral infarction affecting right dominant side: Secondary | ICD-10-CM | POA: Diagnosis not present

## 2022-12-16 DIAGNOSIS — M62521 Muscle wasting and atrophy, not elsewhere classified, right upper arm: Secondary | ICD-10-CM | POA: Diagnosis not present

## 2022-12-16 DIAGNOSIS — K219 Gastro-esophageal reflux disease without esophagitis: Secondary | ICD-10-CM | POA: Diagnosis not present

## 2022-12-16 DIAGNOSIS — K5900 Constipation, unspecified: Secondary | ICD-10-CM | POA: Diagnosis not present

## 2022-12-16 DIAGNOSIS — G4733 Obstructive sleep apnea (adult) (pediatric): Secondary | ICD-10-CM | POA: Diagnosis not present

## 2022-12-16 DIAGNOSIS — F02A3 Dementia in other diseases classified elsewhere, mild, with mood disturbance: Secondary | ICD-10-CM | POA: Diagnosis not present

## 2022-12-16 DIAGNOSIS — D649 Anemia, unspecified: Secondary | ICD-10-CM | POA: Diagnosis not present

## 2022-12-16 DIAGNOSIS — M6281 Muscle weakness (generalized): Secondary | ICD-10-CM | POA: Diagnosis not present

## 2022-12-16 DIAGNOSIS — I25119 Atherosclerotic heart disease of native coronary artery with unspecified angina pectoris: Secondary | ICD-10-CM | POA: Diagnosis not present

## 2022-12-16 DIAGNOSIS — Z9181 History of falling: Secondary | ICD-10-CM | POA: Diagnosis not present

## 2022-12-16 DIAGNOSIS — I69315 Cognitive social or emotional deficit following cerebral infarction: Secondary | ICD-10-CM | POA: Diagnosis not present

## 2022-12-16 DIAGNOSIS — E785 Hyperlipidemia, unspecified: Secondary | ICD-10-CM | POA: Diagnosis not present

## 2022-12-16 DIAGNOSIS — J449 Chronic obstructive pulmonary disease, unspecified: Secondary | ICD-10-CM | POA: Diagnosis not present

## 2022-12-16 DIAGNOSIS — M62522 Muscle wasting and atrophy, not elsewhere classified, left upper arm: Secondary | ICD-10-CM | POA: Diagnosis not present

## 2022-12-16 DIAGNOSIS — Z556 Problems related to health literacy: Secondary | ICD-10-CM | POA: Diagnosis not present

## 2022-12-16 DIAGNOSIS — Z7982 Long term (current) use of aspirin: Secondary | ICD-10-CM | POA: Diagnosis not present

## 2022-12-16 DIAGNOSIS — Z955 Presence of coronary angioplasty implant and graft: Secondary | ICD-10-CM | POA: Diagnosis not present

## 2022-12-16 DIAGNOSIS — F02A18 Dementia in other diseases classified elsewhere, mild, with other behavioral disturbance: Secondary | ICD-10-CM | POA: Diagnosis not present

## 2022-12-16 DIAGNOSIS — G309 Alzheimer's disease, unspecified: Secondary | ICD-10-CM | POA: Diagnosis not present

## 2022-12-16 DIAGNOSIS — Z602 Problems related to living alone: Secondary | ICD-10-CM | POA: Diagnosis not present

## 2022-12-16 DIAGNOSIS — I10 Essential (primary) hypertension: Secondary | ICD-10-CM | POA: Diagnosis not present

## 2022-12-24 DIAGNOSIS — G309 Alzheimer's disease, unspecified: Secondary | ICD-10-CM | POA: Diagnosis not present

## 2022-12-30 NOTE — Progress Notes (Deleted)
RESCHEDULED PER PATIENT

## 2022-12-31 ENCOUNTER — Inpatient Hospital Stay: Payer: 59 | Admitting: Physician Assistant

## 2022-12-31 ENCOUNTER — Inpatient Hospital Stay: Payer: 59 | Attending: Hematology

## 2023-01-02 DIAGNOSIS — I25119 Atherosclerotic heart disease of native coronary artery with unspecified angina pectoris: Secondary | ICD-10-CM | POA: Diagnosis not present

## 2023-01-02 DIAGNOSIS — F02A3 Dementia in other diseases classified elsewhere, mild, with mood disturbance: Secondary | ICD-10-CM | POA: Diagnosis not present

## 2023-01-02 DIAGNOSIS — G309 Alzheimer's disease, unspecified: Secondary | ICD-10-CM | POA: Diagnosis not present

## 2023-01-02 DIAGNOSIS — J449 Chronic obstructive pulmonary disease, unspecified: Secondary | ICD-10-CM | POA: Diagnosis not present

## 2023-01-02 DIAGNOSIS — I69351 Hemiplegia and hemiparesis following cerebral infarction affecting right dominant side: Secondary | ICD-10-CM | POA: Diagnosis not present

## 2023-01-02 DIAGNOSIS — F02A18 Dementia in other diseases classified elsewhere, mild, with other behavioral disturbance: Secondary | ICD-10-CM | POA: Diagnosis not present

## 2023-03-20 DEATH — deceased
